# Patient Record
Sex: Male | Born: 1955 | Race: White | Hispanic: No | Marital: Married | Smoking: Former smoker
Health system: Southern US, Community
[De-identification: ages and names within clinical notes are randomized; demographics above are authoritative.]

## PROBLEM LIST (undated history)

## (undated) DIAGNOSIS — K279 Peptic ulcer, site unspecified, unspecified as acute or chronic, without hemorrhage or perforation: Secondary | ICD-10-CM

## (undated) DIAGNOSIS — E785 Hyperlipidemia, unspecified: Secondary | ICD-10-CM

## (undated) DIAGNOSIS — I10 Essential (primary) hypertension: Secondary | ICD-10-CM

## (undated) DIAGNOSIS — N138 Other obstructive and reflux uropathy: Secondary | ICD-10-CM

## (undated) DIAGNOSIS — J189 Pneumonia, unspecified organism: Secondary | ICD-10-CM

## (undated) DIAGNOSIS — G4730 Sleep apnea, unspecified: Secondary | ICD-10-CM

## (undated) DIAGNOSIS — K219 Gastro-esophageal reflux disease without esophagitis: Secondary | ICD-10-CM

## (undated) DIAGNOSIS — R0683 Snoring: Secondary | ICD-10-CM

## (undated) DIAGNOSIS — R351 Nocturia: Secondary | ICD-10-CM

## (undated) DIAGNOSIS — R200 Anesthesia of skin: Secondary | ICD-10-CM

## (undated) DIAGNOSIS — M1990 Unspecified osteoarthritis, unspecified site: Secondary | ICD-10-CM

## (undated) DIAGNOSIS — J449 Chronic obstructive pulmonary disease, unspecified: Secondary | ICD-10-CM

## (undated) DIAGNOSIS — Z86010 Personal history of colonic polyps: Secondary | ICD-10-CM

## (undated) DIAGNOSIS — R011 Cardiac murmur, unspecified: Secondary | ICD-10-CM

## (undated) DIAGNOSIS — E119 Type 2 diabetes mellitus without complications: Secondary | ICD-10-CM

## (undated) DIAGNOSIS — M549 Dorsalgia, unspecified: Secondary | ICD-10-CM

## (undated) DIAGNOSIS — N401 Enlarged prostate with lower urinary tract symptoms: Secondary | ICD-10-CM

## (undated) HISTORY — DX: Snoring: R06.83

## (undated) HISTORY — DX: Dorsalgia, unspecified: M54.9

## (undated) HISTORY — DX: Anesthesia of skin: R20.0

## (undated) HISTORY — DX: Other obstructive and reflux uropathy: N40.1

## (undated) HISTORY — DX: Pneumonia, unspecified organism: J18.9

## (undated) HISTORY — DX: Type 2 diabetes mellitus without complications: E11.9

## (undated) HISTORY — DX: Sleep apnea, unspecified: G47.30

## (undated) HISTORY — DX: Nocturia: R35.1

## (undated) HISTORY — DX: Unspecified osteoarthritis, unspecified site: M19.90

## (undated) HISTORY — DX: Cardiac murmur, unspecified: R01.1

## (undated) HISTORY — DX: Essential (primary) hypertension: I10

## (undated) HISTORY — DX: Gastro-esophageal reflux disease without esophagitis: K21.9

## (undated) HISTORY — DX: Hyperlipidemia, unspecified: E78.5

## (undated) HISTORY — DX: Personal history of colonic polyps: Z86.010

## (undated) HISTORY — DX: Chronic obstructive pulmonary disease, unspecified: J44.9

## (undated) HISTORY — DX: Peptic ulcer, site unspecified, unspecified as acute or chronic, without hemorrhage or perforation: K27.9

## (undated) HISTORY — DX: Other obstructive and reflux uropathy: N13.8

---

## 1985-01-05 HISTORY — PX: APPENDECTOMY: SHX54

## 1999-10-22 ENCOUNTER — Encounter: Payer: Self-pay | Admitting: Emergency Medicine

## 1999-10-22 ENCOUNTER — Emergency Department (HOSPITAL_COMMUNITY): Admission: EM | Admit: 1999-10-22 | Discharge: 1999-10-22 | Payer: Self-pay | Admitting: Emergency Medicine

## 2003-02-10 ENCOUNTER — Encounter: Admission: RE | Admit: 2003-02-10 | Discharge: 2003-02-10 | Payer: Self-pay | Admitting: Internal Medicine

## 2003-10-17 ENCOUNTER — Encounter: Admission: RE | Admit: 2003-10-17 | Discharge: 2003-10-17 | Payer: Self-pay | Admitting: Endocrinology

## 2007-03-28 ENCOUNTER — Inpatient Hospital Stay (HOSPITAL_COMMUNITY): Admission: EM | Admit: 2007-03-28 | Discharge: 2007-03-29 | Payer: Self-pay | Admitting: Emergency Medicine

## 2007-03-28 ENCOUNTER — Ambulatory Visit: Payer: Self-pay | Admitting: Cardiology

## 2008-05-12 ENCOUNTER — Encounter: Admission: RE | Admit: 2008-05-12 | Discharge: 2008-05-12 | Payer: Self-pay | Admitting: Internal Medicine

## 2008-05-18 ENCOUNTER — Ambulatory Visit: Payer: Self-pay | Admitting: Internal Medicine

## 2008-05-18 DIAGNOSIS — N138 Other obstructive and reflux uropathy: Secondary | ICD-10-CM | POA: Insufficient documentation

## 2008-05-18 DIAGNOSIS — M1990 Unspecified osteoarthritis, unspecified site: Secondary | ICD-10-CM | POA: Insufficient documentation

## 2008-05-18 DIAGNOSIS — K279 Peptic ulcer, site unspecified, unspecified as acute or chronic, without hemorrhage or perforation: Secondary | ICD-10-CM | POA: Insufficient documentation

## 2008-05-18 DIAGNOSIS — R109 Unspecified abdominal pain: Secondary | ICD-10-CM | POA: Insufficient documentation

## 2008-05-18 DIAGNOSIS — N401 Enlarged prostate with lower urinary tract symptoms: Secondary | ICD-10-CM | POA: Insufficient documentation

## 2008-05-18 DIAGNOSIS — I10 Essential (primary) hypertension: Secondary | ICD-10-CM | POA: Insufficient documentation

## 2008-05-18 DIAGNOSIS — K219 Gastro-esophageal reflux disease without esophagitis: Secondary | ICD-10-CM | POA: Insufficient documentation

## 2008-06-01 ENCOUNTER — Ambulatory Visit: Payer: Self-pay | Admitting: Internal Medicine

## 2008-06-01 DIAGNOSIS — M542 Cervicalgia: Secondary | ICD-10-CM | POA: Insufficient documentation

## 2008-06-14 ENCOUNTER — Ambulatory Visit: Payer: Self-pay | Admitting: Internal Medicine

## 2008-06-14 DIAGNOSIS — R011 Cardiac murmur, unspecified: Secondary | ICD-10-CM | POA: Insufficient documentation

## 2008-06-18 ENCOUNTER — Telehealth: Payer: Self-pay | Admitting: Internal Medicine

## 2008-06-19 ENCOUNTER — Telehealth: Payer: Self-pay | Admitting: Internal Medicine

## 2008-06-21 ENCOUNTER — Ambulatory Visit: Payer: Self-pay

## 2008-06-21 ENCOUNTER — Encounter: Payer: Self-pay | Admitting: Internal Medicine

## 2008-06-22 ENCOUNTER — Encounter: Payer: Self-pay | Admitting: Internal Medicine

## 2008-07-13 ENCOUNTER — Ambulatory Visit: Payer: Self-pay | Admitting: Internal Medicine

## 2008-07-13 DIAGNOSIS — M549 Dorsalgia, unspecified: Secondary | ICD-10-CM | POA: Insufficient documentation

## 2008-07-26 ENCOUNTER — Telehealth: Payer: Self-pay | Admitting: Internal Medicine

## 2008-07-26 ENCOUNTER — Encounter: Payer: Self-pay | Admitting: Internal Medicine

## 2008-07-26 ENCOUNTER — Encounter (INDEPENDENT_AMBULATORY_CARE_PROVIDER_SITE_OTHER): Payer: Self-pay | Admitting: *Deleted

## 2008-09-11 ENCOUNTER — Encounter
Admission: RE | Admit: 2008-09-11 | Discharge: 2008-12-10 | Payer: Self-pay | Admitting: Physical Medicine & Rehabilitation

## 2008-09-13 ENCOUNTER — Ambulatory Visit: Payer: Self-pay | Admitting: Physical Medicine & Rehabilitation

## 2008-09-14 ENCOUNTER — Ambulatory Visit: Payer: Self-pay | Admitting: Internal Medicine

## 2008-09-20 ENCOUNTER — Encounter
Admission: RE | Admit: 2008-09-20 | Discharge: 2008-11-14 | Payer: Self-pay | Admitting: Physical Medicine & Rehabilitation

## 2008-10-11 ENCOUNTER — Ambulatory Visit: Payer: Self-pay | Admitting: Physical Medicine & Rehabilitation

## 2008-10-25 ENCOUNTER — Ambulatory Visit: Payer: Self-pay | Admitting: Physical Medicine & Rehabilitation

## 2008-11-23 ENCOUNTER — Ambulatory Visit: Payer: Self-pay | Admitting: Physical Medicine & Rehabilitation

## 2009-02-07 ENCOUNTER — Encounter
Admission: RE | Admit: 2009-02-07 | Discharge: 2009-02-12 | Payer: Self-pay | Admitting: Physical Medicine & Rehabilitation

## 2009-02-12 ENCOUNTER — Ambulatory Visit: Payer: Self-pay | Admitting: Physical Medicine & Rehabilitation

## 2009-03-07 IMAGING — CR DG CHEST 2V
2 series · 2 of 2 positions shown · non-contrast
Comparison: 10/02/2003

CLINICAL DATA: *Chest pain;

CHEST - 2 VIEW

[w chest pa]
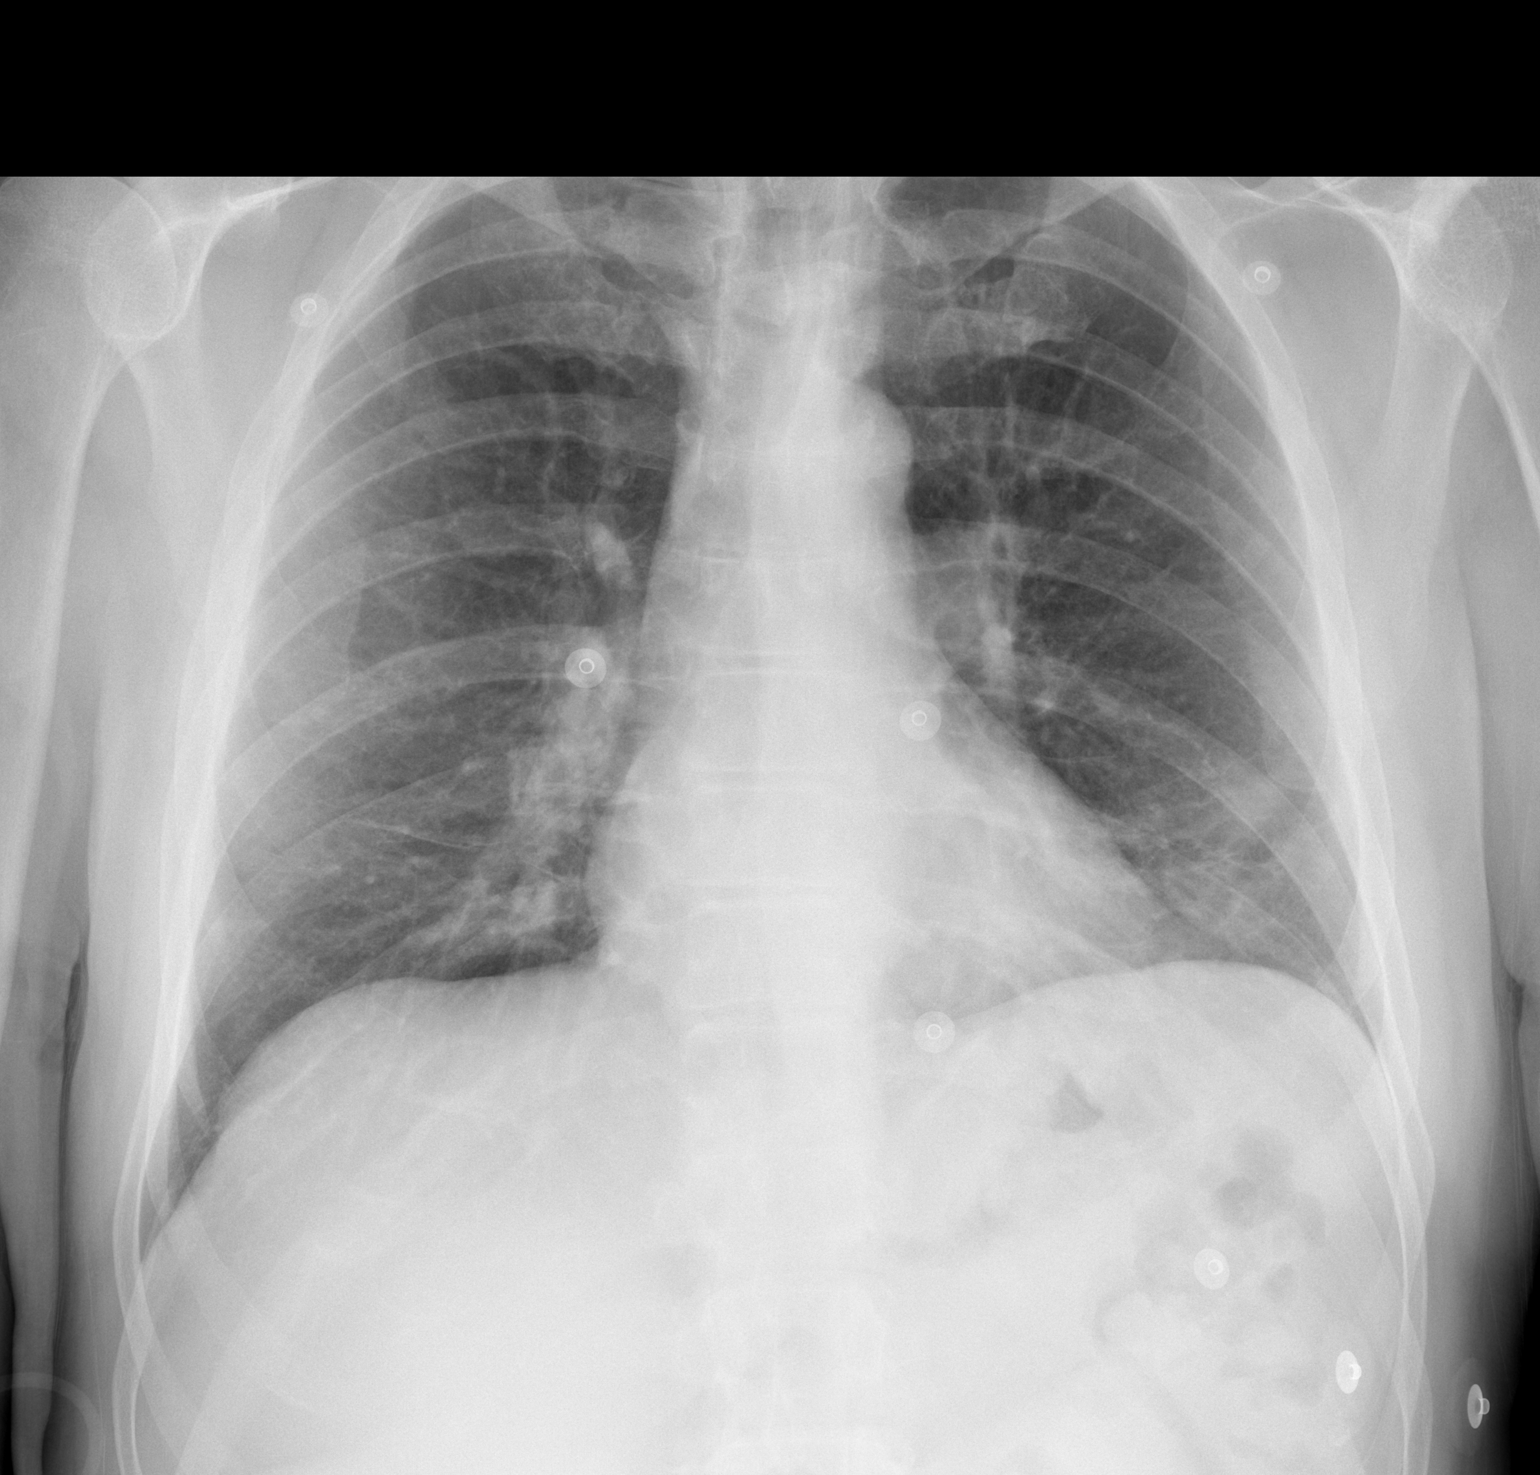

[w chest lat]
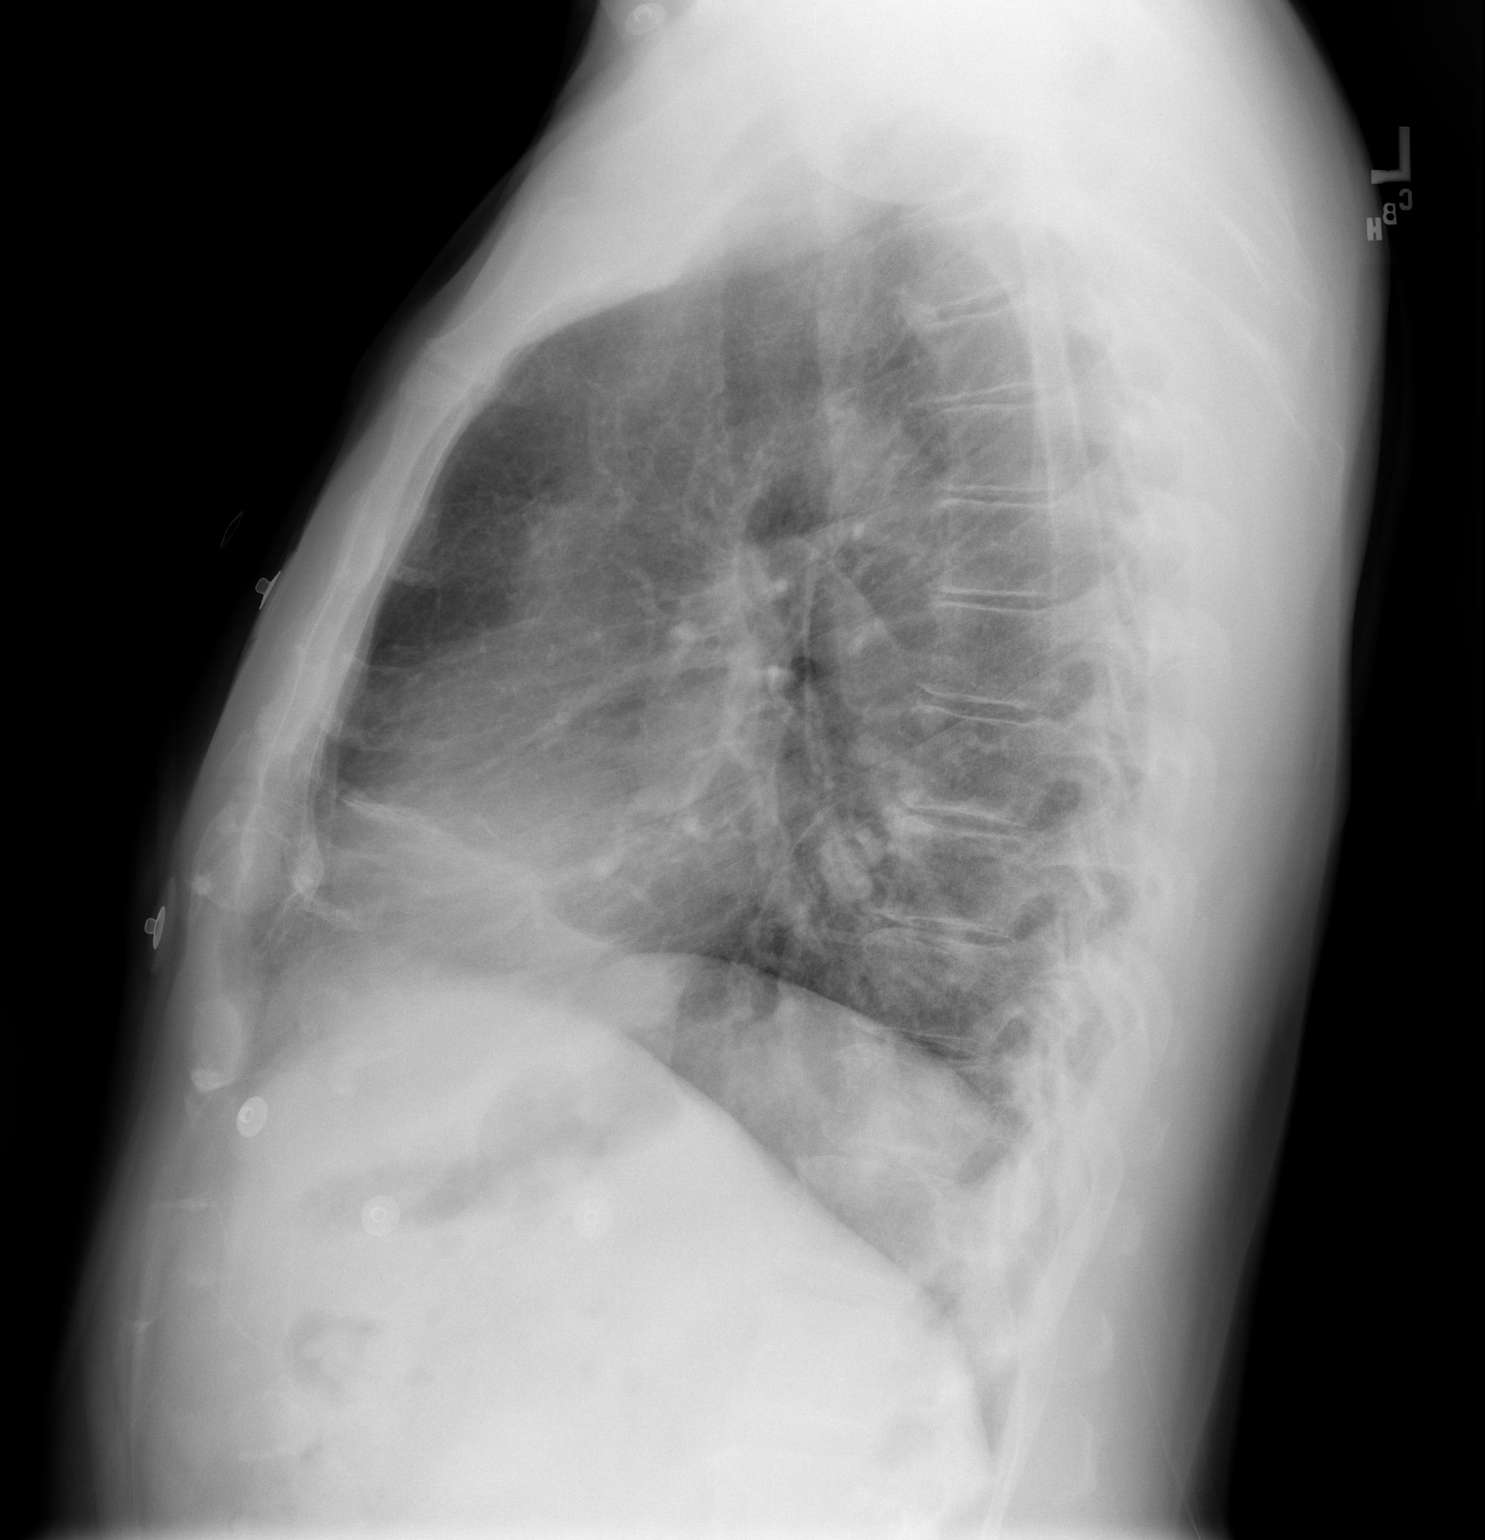

[2 of 2 positions shown; findings below may reference images not displayed]

FINDINGS: Patchy lingular airspace opacity.  Right lung clear.
Heart size normal.  No effusion.  Visualized bones unremarkable.
IMPRESSION: 1.  Patchy lingular opacity possibly pneumonia

## 2009-05-03 ENCOUNTER — Encounter
Admission: RE | Admit: 2009-05-03 | Discharge: 2009-05-07 | Payer: Self-pay | Admitting: Physical Medicine & Rehabilitation

## 2009-05-07 ENCOUNTER — Ambulatory Visit: Payer: Self-pay | Admitting: Physical Medicine & Rehabilitation

## 2009-10-21 ENCOUNTER — Encounter
Admission: RE | Admit: 2009-10-21 | Discharge: 2009-10-25 | Payer: Self-pay | Admitting: Physical Medicine & Rehabilitation

## 2009-10-25 ENCOUNTER — Ambulatory Visit: Payer: Self-pay | Admitting: Physical Medicine & Rehabilitation

## 2010-05-20 NOTE — H&P (Signed)
NAME:  Benjamin Conner, Benjamin Conner NO.:  [id number]   MEDICAL RECORD NO.:  [id number]          PATIENT TYPE:  INP   LOCATION:  1828                         FACILITY:  [facility]   PHYSICIAN:  Jonelle Sidle, MD DATE OF BIRTH:  07/03/1955   DATE OF ADMISSION:  03/28/2007  DATE OF DISCHARGE:                              HISTORY & PHYSICAL   PRIMARY CARDIOLOGIST:  New, seeing Dr. Diona Browner.   PRIMARY CARE PHYSICIAN:  He is pending a visit with Dr. Romero Belling.   PATIENT PROFILE:  A 55 year old married Caucasian male with prior family  history of CAD as well as untreated hypertension and tobacco abuse who  presents with unstable angina.   PROBLEMS:  1. Unstable angina.  2. Untreated hypertension.  3. Ongoing tobacco abuse with 35+ pack-year history, currently smoking      a pack a day.  4. Status post appendectomy about 15-20 years ago.  5. Status post left foot surgery for bone spurs.   HISTORY OF PRESENT ILLNESS:  A 55 year old married Caucasian male with  family history of CAD, untreated hypertension, and ongoing tobacco  abuse. He works night shift and was in his usual state of health until  approximately 5 p.m. when he awoke with 10/10 fairly focal and sharp  chest pain at the left upper sternal border with associated dyspnea,  nausea, diaphoresis, and lightheadedness. As he got up to shower and  dress, his symptoms became significantly worse and also changed into a  more retrosternal chest heaviness. After getting dressed, he presented  to urgent care where he was treated with two sublingual nitroglycerin  tablets with reduction in pain from 10/10 to 4/10. EMS was activated,  and he was given an additional nitroglycerin en route to the ED. His  pain resolved completely. We were asked to see the patient by Dr.  Merla Riches who called [place] from urgent care. Currently, Benjamin Conner is pain  free and offers no complaints.   ALLERGIES:  No known drug allergies.   HOME  MEDICATIONS:  Aspirin 81 mg every day.   FAMILY HISTORY:  Mother is alive at age 46 with a history of CAD, status  post PCI last year. Father died at age 52 following multiple MIs, the  first of which occurred in his early 6's. He also had a history of  COPD. The patient has a brother who has a history of diabetes.   SOCIAL HISTORY:  Lives in [place] with his wife. 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 He has two grown  children and two grandchildren. He has a 35+ pack-year history of  tobacco abuse, currently smoking one pack a day. He usually drinks two  beers a day and more on the weekends. He denies any drug use. He does  not routinely exercise.   REVIEW OF SYSTEMS:  Positive for chest pain and shortness of breath,  dyspnea on exertion, lightheadedness, nausea, and constipation.  Otherwise, all systems reviewed and negative.   PHYSICAL EXAMINATION:  VITAL SIGNS:  Temperature 97.7. Heart rate 84,  respirations  16, blood pressure 138/88, pulse-ox 97% on 4 liters.  GENERAL:  Pleasant white male in no acute distress. Awake, alert, and  oriented x3.  NECK:  No bruits or JVD.  LUNGS:  Respirations regular and unlabored. Clear to auscultation.  CARDIAC:  Regular S1, S2. No S3, S4 or murmurs.  ABDOMEN:  Round, soft, nontender, nondistended. Bowel sounds are present  x4.  EXTREMITIES:  Warm, dry, pink. No clubbing, cyanosis, or edema. Dorsalis  pedis, posterior tibial pulses 2+ and equal bilaterally. No femoral  bruits are noted.  HEENT:  Normal.  NEURO:  Grossly intact and nonfocal.  SKIN:  Warm and dry without lesions or masses.   Chest x-ray is pending. EKG shows sinus rhythm at a rate of 77, left  axis deviation. There is poor R-wave progression and 0.5 to 1 mm J-point  elevation of V2 through V3.   LABORATORY DATA:  Pending.   ASSESSMENT AND PLAN:  1. Unstable angina. Symptoms concerning for angina. He is now pain      free. We will  plan to admit and cycle cardiac markers. Add heparin,      nitro, beta-blocker, aspirin, statin, Plavix, and plan      catheterization in the a.m.  2. Hypertension. Add beta-blocker for now. Follow.  3. Lipid status currently unknown. Check lipids and liver function      tests. Add statin.  4. Tobacco abuse. Cessation strongly advised. Will obtain smoking      cessation counsel.      [name], ANP      Jonelle Sidle, MD  Electronically Signed    CB/MEDQ  D:  03/28/2007  T:  03/28/2007  Job:  [id number]

## 2010-05-20 NOTE — Discharge Summary (Signed)
NAME:  ANIRUDH, BAIZ NO.:  [id number]   MEDICAL RECORD NO.:  [id number]          PATIENT TYPE:  INP   LOCATION:  3707                         FACILITY:  [facility]   PHYSICIAN:  Rollene Rotunda, MD, FACCDATE OF BIRTH:  06/26/1955   DATE OF ADMISSION:  03/28/2007  DATE OF DISCHARGE:  03/29/2007                               DISCHARGE SUMMARY   ADDENDUM.   MEDICATIONS:  1. Aspirin 81 mg daily.  2. Avelox 400 mg daily x6 additional days.  3. Hydrochlorothiazide 12.5 mg daily.  4. K-Dur 10 mEq daily.      [name], ANP      Rollene Rotunda, MD, [facility]  Electronically Signed    CB/MEDQ  D:  03/29/2007  T:  03/30/2007  Job:  [id number]

## 2010-05-20 NOTE — Cardiovascular Report (Signed)
NAME:  Benjamin Conner, Benjamin Conner NO.:  [id number]   MEDICAL RECORD NO.:  [id number]          PATIENT TYPE:  INP   LOCATION:  3707                         FACILITY:  [facility]   PHYSICIAN:  Rollene Rotunda, MD, FACCDATE OF BIRTH:  1955/06/17   DATE OF PROCEDURE:  03/29/2007  DATE OF DISCHARGE:                            CARDIAC CATHETERIZATION   PRIMARY:  None.   CARDIOLOGIST:  Jonelle Sidle, M.D.   PROCEDURE:  Left heart catheterization/coronary arteriography.   INDICATION:  Evaluate patient with chest pain suggestive of unstable  angina.   PROCEDURE NOTE:  Left heart catheterization was performed via the right  femoral artery.  The artery was cannulated using anterior wall puncture.  A #6-French arterial sheath was inserted via the modified Seldinger  technique.  Preformed Judkins and a pigtail catheter were utilized.  The  patient tolerated the procedure well and left the lab in stable  condition.   RESULTS:  Hemodynamics:  LV 169/14, AO 164/82.   Coronaries:  The left main was normal.  The LAD wrapped the apex and was  normal.  First diagonal was large with ostial 25% stenosis.  The  circumflex in the AV groove was normal.  Ramus intermediate was moderate-  sized and normal.  Mid obtuse marginal was large and normal.  The right  coronary artery was a large dominant vessel.  It was normal throughout  its course.  The PDA was large and normal.  Posterolateral was moderate-  sized and normal.   LEFT VENTRICULOGRAM:  The left ventriculogram was obtained in the RAO  projection.  The EF was 65%.   CONCLUSION:  Normal coronaries.  Normal left ventricular function.   PLAN:  No further cardiac workup is suggested.  The patient does have an  infiltrate on chest x-ray and is being managed for probable pneumonia.  This may explain his symptoms.      Rollene Rotunda, MD, [facility]  Electronically Signed     JH/MEDQ  D:  03/29/2007  T:  03/29/2007  Job:  [id number]   cc:    Jonelle Sidle, MD

## 2010-05-20 NOTE — Discharge Summary (Signed)
NAME:  Benjamin Conner, Benjamin Conner NO.:  [id number]   MEDICAL RECORD NO.:  [id number]          PATIENT TYPE:  INP   LOCATION:  3707                         FACILITY:  [facility]   PHYSICIAN:  Rollene Rotunda, MD, FACCDATE OF BIRTH:  04/08/1955   DATE OF ADMISSION:  03/28/2007  DATE OF DISCHARGE:  03/29/2007                               DISCHARGE SUMMARY   PRIMARY CARDIOLOGIST:  Jonelle Sidle, MD   PRIMARY CARE Leya Paige:  Cleophas Dunker. Everardo All, MD   DISCHARGE DIAGNOSIS:  Chest pain.   SECONDARY DIAGNOSES:  1. Community-acquired pneumonia.  2. Hypertension.  3. Ongoing tobacco abuse.  4. Status post appendectomy about 15-20 years ago.  5. Status post left foot surgery secondary to bone spurs.   ALLERGIES:  No known drug allergies.   PROCEDURE:  Left heart cardiac catheterization.   HISTORY OF PRESENT ILLNESS:  A 55 year old married Caucasian male  without prior cardiac history.  He was in his usual state of health  until the afternoon of March 28, 2007, when he awoke with 10/10, sharp,  shooting left-sided chest discomfort, which became heavy and tight in  nature, and associated with nausea, shortness of breath, diaphoresis,  and lightheadedness.  Symptoms worsened with exertion, prompting him to  present to urgent care where he was treated with nitroglycerin and  aspirin with some relief.  EMS brought the patient to the [facility] ED,  and on en route he received an additional nitroglycerin with complete  relief of discomfort.  In the ED, he was pain free.  ECG showed minimal  J-point elevation in lead V2 and V3.  The patient was admitted for rule  out.   HOSPITAL COURSE:  The patient ruled out for MI and did have some mild  chest heaviness overnight.  Chest x-ray was reviewed and showed patchy  lingular opacity with possible pneumonia.  He was afebrile; however, his  white count was initially elevated at 14.6 and then down to 12.8 on  March 29, 2007.  He was initially on  Avelox therapy.  Secondary to  concerns regarding angina with significant family history of coronary  disease along with untreated hypertension, and ongoing tobacco abuse,  the patient underwent left heart cardiac catheterization this afternoon  revealing normal coronary arteries with an EF of 65% and no wall motion  abnormalities. Mr. Mahler will be discharged home post catheterization.   DISCHARGE LABS:  Hemoglobin 14.6, hematocrit 42.6, WBC 12.8, and  platelets 231.  Sodium 136, potassium 3.9, chloride 105, CO2 25, BUN 16,  creatinine 0.78, and glucose 78.  LFTS were within normal limits.  CK  92, MB 4.3 and troponin I 0.02.  Total cholesterol 178, triglycerides  203, HDL 27,  and LDL 110.  TSH 1.736.   DISPOSITION:  The patient is being discharged home today in good  condition.   FOLLOWUP PLANS AND APPOINTMENTS:  We have asked him to follow up with  Dr. Everardo All in 1-2 weeks.   DISCHARGE MEDICATIONS:  1. Aspirin 81 mg daily.  2. Avelox 400 mg daily.  3. Hydrochlorothiazide 12.5 mg daily.  4. K-Dur 10 mEq daily.      [name], ANP      Rollene Rotunda, MD, [facility]  Electronically Signed    CB/MEDQ  D:  03/29/2007  T:  03/30/2007  Job:  [id number]   cc:   Gregary Signs A. Everardo All, MD

## 2010-09-29 LAB — COMPREHENSIVE METABOLIC PANEL
AST: 26
BUN: 16
CO2: 25
Calcium: 9.6
Chloride: 105
Creatinine, Ser: 0.78
GFR calc Af Amer: >60
GFR calc non Af Amer: >60
Total Bilirubin: 1

## 2010-09-29 LAB — LIPID PANEL
Total CHOL/HDL Ratio: 6.6
Triglycerides: 203 — ABNORMAL HIGH

## 2010-09-29 LAB — CBC
HCT: 42.6
HCT: 46.2
MCHC: 34.3
MCHC: 35
MCV: 89.9
MCV: 90.4
Platelets: 231
Platelets: 233
RDW: 12.1
WBC: 12.8 — ABNORMAL HIGH
WBC: 14.6 — ABNORMAL HIGH

## 2010-09-29 LAB — DIFFERENTIAL
Basophils Absolute: 0.1
Lymphocytes Relative: 19
Lymphs Abs: 2.7
Neutro Abs: 10.4 — ABNORMAL HIGH
Neutrophils Relative %: 71

## 2010-09-29 LAB — MAGNESIUM: Magnesium: 2.3

## 2010-09-29 LAB — HEPARIN LEVEL (UNFRACTIONATED): Heparin Unfractionated: 0.34

## 2010-09-29 LAB — PROTIME-INR
INR: 0.9
Prothrombin Time: 12.4

## 2010-09-29 LAB — APTT: aPTT: 29

## 2010-09-29 LAB — CK TOTAL AND CKMB (NOT AT ARMC): Relative Index: INVALID

## 2010-09-29 LAB — POCT CARDIAC MARKERS: Operator id: 277751

## 2010-09-29 LAB — TROPONIN I: Troponin I: 0.02

## 2011-05-27 ENCOUNTER — Encounter: Payer: Self-pay | Admitting: Physician Assistant

## 2011-05-27 ENCOUNTER — Ambulatory Visit (INDEPENDENT_AMBULATORY_CARE_PROVIDER_SITE_OTHER): Payer: BC Managed Care – PPO | Admitting: Family Medicine

## 2011-05-27 VITALS — BP 141/90 | HR 80 | Temp 97.8°F | Resp 16 | Ht 74.5 in | Wt 246.8 lb

## 2011-05-27 DIAGNOSIS — M479 Spondylosis, unspecified: Secondary | ICD-10-CM

## 2011-05-27 DIAGNOSIS — E8881 Metabolic syndrome: Secondary | ICD-10-CM

## 2011-05-27 DIAGNOSIS — I10 Essential (primary) hypertension: Secondary | ICD-10-CM

## 2011-05-27 DIAGNOSIS — R7989 Other specified abnormal findings of blood chemistry: Secondary | ICD-10-CM

## 2011-05-27 DIAGNOSIS — R42 Dizziness and giddiness: Secondary | ICD-10-CM

## 2011-05-27 DIAGNOSIS — I493 Ventricular premature depolarization: Secondary | ICD-10-CM

## 2011-05-27 LAB — CBC WITH DIFFERENTIAL/PLATELET
Basophils Absolute: 0 10*3/uL (ref 0.0–0.1)
Eosinophils Relative: 3 % (ref 0–5)
HCT: 45.2 % (ref 39.0–52.0)
Hemoglobin: 15.8 g/dL (ref 13.0–17.0)
Lymphocytes Relative: 23 % (ref 12–46)
MCV: 85.6 fL (ref 78.0–100.0)
Monocytes Absolute: 0.8 10*3/uL (ref 0.1–1.0)
Monocytes Relative: 9 % (ref 3–12)
RDW: 12.6 % (ref 11.5–15.5)
WBC: 9.2 10*3/uL (ref 4.0–10.5)

## 2011-05-27 LAB — COMPREHENSIVE METABOLIC PANEL
AST: 41 U/L — ABNORMAL HIGH (ref 0–37)
BUN: 17 mg/dL (ref 6–23)
CO2: 25 mEq/L (ref 19–32)
Calcium: 10.3 mg/dL (ref 8.4–10.5)
Chloride: 102 mEq/L (ref 96–112)
Creat: 1.11 mg/dL (ref 0.50–1.35)
Glucose, Bld: 125 mg/dL — ABNORMAL HIGH (ref 70–99)

## 2011-05-27 LAB — LIPID PANEL
Cholesterol: 232 mg/dL — ABNORMAL HIGH (ref 0–200)
HDL: 40 mg/dL (ref >39)
Total CHOL/HDL Ratio: 5.8 Ratio
Triglycerides: 491 mg/dL — ABNORMAL HIGH (ref <150)

## 2011-05-27 MED ORDER — MELOXICAM 15 MG PO TABS: 15.0000 mg | ORAL_TABLET | Freq: Every day | ORAL | Status: DC

## 2011-05-27 MED ORDER — ESOMEPRAZOLE MAGNESIUM 40 MG PO CPDR: 40.0000 mg | DELAYED_RELEASE_CAPSULE | Freq: Every day | ORAL | Status: DC

## 2011-05-27 MED ORDER — FLUTICASONE PROPIONATE 50 MCG/ACT NA SUSP: 2.0000 | Freq: Every day | NASAL | Status: DC

## 2011-05-27 MED ORDER — VALSARTAN-HYDROCHLOROTHIAZIDE 160-25 MG PO TABS: 1.0000 | ORAL_TABLET | Freq: Every day | ORAL | Status: DC

## 2011-05-27 NOTE — Progress Notes (Signed)
  Subjective:    Patient ID: Benjamin Conner, male    DOB: August 01, 1955, 56 y.o.   MRN: [id number]  HPI Pt here for check up of htn, elevated glucose, and hyperlipidemia.  Saw the neurologist for his back pain who felt his pain is mostly related to DJD.  He is on gabapentin 300mg  (2bid) and flexeril 5mg  (2bid) which has his pain adequately controlled.  He c/o a few episodes of dizziness over the last few days that are brief.  He does admit to some chest tightness but can't relate it to the same time as the dizziness.  (Also has some nasal congestion and allergy symptoms.).  Denies any SOB, DOE, palpitations, jaw, arm, or back pain.  His dad developed afib in his late 71s or early 24s.  Died at 64 with and MI and pneumonia.  Episodes of dizziness have been fleeting and 2-3 times.  Urology is still following him for slightly enlarged prostate.   Review of Systems  All other systems reviewed and are negative.       Objective:   Physical Exam  Constitutional: He is oriented to person, place, and time. He appears well-developed and well-nourished.       obese  HENT:  Head: Normocephalic and atraumatic.       TM B full, no infection. Nasal turbinates enlarged and boggy.  Neck: Normal range of motion. Neck supple. No JVD present.  Cardiovascular: Normal rate, regular rhythm, normal heart sounds and intact distal pulses.  Exam reveals no gallop and no friction rub.   No murmur heard. Pulmonary/Chest: Effort normal and breath sounds normal. No respiratory distress. He has no wheezes. He has no rales. He exhibits no tenderness.  Neurological: He is alert and oriented to person, place, and time.  Skin: Skin is warm and dry.     EKG:  No Changes on EKG from 2009, but there is a PVC on his 3 page rhythm strip. Reviewed by myself and Dr. Audria Nine.      Assessment & Plan:  Dizziness-R/O cardiac cause.  Advised 911 if CP develops. Abnormal EKG. Htn-stable when he takes his meds-he hasn't had any  today. Hyperlipidemia/metabolic syndrome/elevated glucose w/o diagnosis of diabetes.  Advised cut out sugars and refined carbs.  [facility] Diet program. Back pain-stable.  It is fine with me for [place] to RF his gabapentin and flexeril if it is needed.

## 2011-05-28 ENCOUNTER — Encounter: Payer: Self-pay | Admitting: Cardiology

## 2011-05-28 ENCOUNTER — Ambulatory Visit (INDEPENDENT_AMBULATORY_CARE_PROVIDER_SITE_OTHER): Payer: Self-pay | Admitting: Cardiology

## 2011-05-28 VITALS — BP 144/92 | HR 97 | Ht 75.0 in | Wt 248.0 lb

## 2011-05-28 DIAGNOSIS — R011 Cardiac murmur, unspecified: Secondary | ICD-10-CM

## 2011-05-28 DIAGNOSIS — R42 Dizziness and giddiness: Secondary | ICD-10-CM

## 2011-05-28 DIAGNOSIS — R079 Chest pain, unspecified: Secondary | ICD-10-CM

## 2011-05-28 DIAGNOSIS — I10 Essential (primary) hypertension: Secondary | ICD-10-CM

## 2011-05-28 NOTE — Progress Notes (Signed)
HPI: 56 year old male for evaluation of dizziness. Echocardiogram in June of 2010 showed normal LV function and grade 1 diastolic dysfunction. Laboratories on 05/27/2011 showed a hemoglobin of 15.8. Potassium 4.0. TSH 2.968. Patient has dyspnea with more extreme activities but not routine activities. No orthopnea, PND or pedal edema. Has occasional chest pain. Substernal without radiation. Associated shortness of breath and diaphoresis but no nausea. Pain can increase with inspiration. Last several minutes to 15 minutes. Not exertional. Over the last several days has noted some dizziness. Last all day at times. No syncope. No palpitations.  Current Outpatient Prescriptions  Medication Sig Dispense Refill  . aspirin 81 MG tablet Take 81 mg by mouth daily.      . cyclobenzaprine (FLEXERIL) 5 MG tablet Take 5 mg by mouth 3 (three) times daily as needed.      Marland Kitchen esomeprazole (NEXIUM) 40 MG capsule Take 1 capsule (40 mg total) by mouth daily before breakfast.  90 capsule  3  . fexofenadine (ALLEGRA) 180 MG tablet Take 180 mg by mouth daily.      . fluticasone (FLONASE) 50 MCG/ACT nasal spray Place 2 sprays into the nose daily.  16 g  6  . gabapentin (NEURONTIN) 300 MG capsule Take 600 mg by mouth 2 (two) times daily.      . meloxicam (MOBIC) 15 MG tablet Take 1 tablet (15 mg total) by mouth daily.  90 tablet  3  . Omega-3 Fatty Acids (FISH OIL PO) Take 1 tablet by mouth daily.      . valsartan-hydrochlorothiazide (DIOVAN-HCT) 160-25 MG per tablet Take 1 tablet by mouth daily.  90 tablet  3    Allergies  Allergen Reactions  . Asa (Aspirin)     Past Medical History  Diagnosis Date  . HYPERTENSION   . GERD   . PEPTIC ULCER DISEASE   . HYPERTROPHY PROSTATE W/UR OBST & OTH LUTS   . OSTEOARTHRITIS   . BACK PAIN   . Hyperlipidemia   . Pneumonia     Past Surgical History  Procedure Date  . Appendectomy     History   Social History  . Marital Status: Married    Spouse Name: N/A    Number  of Children: 2  . Years of Education: N/A   Occupational History  .      Cashier   Social History Main Topics  . Smoking status: Former Games developer  . Smokeless tobacco: Not on file  . Alcohol Use: Yes     Occasional  . Drug Use: Not on file  . Sexually Active: Not on file   Other Topics Concern  . Not on file   Social History Narrative  . No narrative on file    Family History  Problem Relation Age of Onset  . Heart disease Father   . Coronary artery disease Mother     Stent at age 92    ROS: problems with back pain but no fevers or chills, productive cough, hemoptysis, dysphasia, odynophagia, melena, hematochezia, dysuria, hematuria, rash, seizure activity, orthopnea, PND, pedal edema, claudication. Remaining systems are negative.  Physical Exam:   Blood pressure 144/92, pulse 97, height 6\' 3"  (1.905 m), weight 112.492 kg (248 lb).  General:  Well developed/well nourished in NAD Skin warm/dry Patient not depressed No peripheral clubbing Back-normal HEENT-normal/normal eyelids Neck supple/normal carotid upstroke bilaterally; no bruits; no JVD; no thyromegaly chest - CTA/ normal expansion CV - RRR/normal S1 and S2; no rubs or gallops;  PMI nondisplaced, 2/6  systolic murmur left sternal border. Abdomen -NT/ND, no HSM, no mass, + bowel sounds, no bruit 2+ femoral pulses, no bruits Ext-no edema, chords, 2+ DP Neuro-grossly nonfocal  ECG electrocardiogram on 05/27/2011 showed sinus rhythm with no ST changes. Rhythm strip showed isolated PVC.

## 2011-05-28 NOTE — Assessment & Plan Note (Signed)
Plan echocardiogram to further assess. 

## 2011-05-28 NOTE — Patient Instructions (Signed)
Your physician has requested that you have an echocardiogram. Echocardiography is a painless test that uses sound waves to create images of your heart. It provides your doctor with information about the size and shape of your heart and how well your heart's chambers and valves are working. This procedure takes approximately one hour. There are no restrictions for this procedure.   Your physician has requested that you have an exercise tolerance test. For further information please visit [email or web address] Please also follow instruction sheet, as given.  Exercise Stress Electrocardiography An exercise stress test is a heart test (EKG) which is done while you are moving. You will walk on a treadmill. This test will tell your doctor how your heart does when it is forced to work harder and how much activity you can safely handle. BEFORE THE TEST  Wear shorts or athletic pants.   Wear comfortable tennis shoes.   Women need to wear a bra that allows patches to be put on under it.  TEST  An EKG cable will be attached to your waist. This cable is hooked up to patches, which look like round stickers stuck to your chest.   You will be asked to walk on the treadmill.   You will walk until you are too tired or until you are told to stop.   Tell the doctor right away if you have:   Chest pain.   Leg cramps.   Shortness of breath.   Dizziness.   The test may last 30 minutes to 1 hour. The timing depends on your physical condition and the condition of your heart.  AFTER THE TEST  You will rest for about 6 minutes. During this time, your heart rhythm and blood pressure will be checked.   The testing equipment will be removed from your body and you can get dressed.   You may go home or back to your hospital room. You may keep doing all your usual activities as told by your doctor.  Finding out the results of your test Ask when your test results will be ready. Make sure you get your test  results. Document Released: 06/10/2007 Document Revised: 12/11/2010 Document Reviewed: 06/10/2007 [facility] Patient Information 2012 [place], [place].

## 2011-05-28 NOTE — Assessment & Plan Note (Signed)
Symptoms atypical. Schedule exercise treadmill. 

## 2011-05-28 NOTE — Assessment & Plan Note (Signed)
Electrocardiogram is normal. Occasional PVC. No association of dizziness with palpitations. I do not think this is cardiac related. No plans for further cardiac evaluation from this standpoint.

## 2011-05-28 NOTE — Assessment & Plan Note (Signed)
Blood pressure elevated. Continue present medications and follow. Increase medications as needed. 

## 2011-05-29 ENCOUNTER — Other Ambulatory Visit: Payer: Self-pay

## 2011-05-29 ENCOUNTER — Ambulatory Visit (HOSPITAL_COMMUNITY): Payer: BC Managed Care – PPO | Attending: Cardiology

## 2011-05-29 DIAGNOSIS — R079 Chest pain, unspecified: Secondary | ICD-10-CM | POA: Insufficient documentation

## 2011-05-29 DIAGNOSIS — I10 Essential (primary) hypertension: Secondary | ICD-10-CM | POA: Insufficient documentation

## 2011-05-29 DIAGNOSIS — R42 Dizziness and giddiness: Secondary | ICD-10-CM | POA: Insufficient documentation

## 2011-05-29 DIAGNOSIS — I519 Heart disease, unspecified: Secondary | ICD-10-CM | POA: Insufficient documentation

## 2011-05-29 DIAGNOSIS — I517 Cardiomegaly: Secondary | ICD-10-CM | POA: Insufficient documentation

## 2011-05-29 DIAGNOSIS — R011 Cardiac murmur, unspecified: Secondary | ICD-10-CM

## 2011-05-29 DIAGNOSIS — Z87891 Personal history of nicotine dependence: Secondary | ICD-10-CM | POA: Insufficient documentation

## 2011-06-08 ENCOUNTER — Telehealth: Payer: Self-pay | Admitting: Cardiology

## 2011-06-08 NOTE — Telephone Encounter (Signed)
Echocardiogram results given to pt. 

## 2011-06-08 NOTE — Telephone Encounter (Signed)
New problem:  Patient calling for test result

## 2011-06-19 ENCOUNTER — Ambulatory Visit (INDEPENDENT_AMBULATORY_CARE_PROVIDER_SITE_OTHER): Payer: BC Managed Care – PPO | Admitting: Nurse Practitioner

## 2011-06-19 ENCOUNTER — Encounter: Payer: Self-pay | Admitting: Nurse Practitioner

## 2011-06-19 DIAGNOSIS — R079 Chest pain, unspecified: Secondary | ICD-10-CM

## 2011-06-19 NOTE — Patient Instructions (Signed)
Walking program is encouraged.  We will see you back in 3 months but sooner if you continue to have symptoms.

## 2011-06-19 NOTE — Procedures (Signed)
Exercise Treadmill Test  Pre-Exercise Testing Evaluation Rhythm: normal sinus  Rate: 73   PR:  .18 QRS:  .09  QT:  .39 QTc: .43     Test  Exercise Tolerance Test Ordering MD: Olga Millers, MD  Interpreting MD: Norma Fredrickson ,NP  Unique Test No: 1  Treadmill:  1  Indication for ETT: chest pain - rule out ischemia  Contraindication to ETT: No   Stress Modality: exercise - treadmill  Cardiac Imaging Performed: non   Protocol: standard Bruce - maximal  Max BP: 189/79  Max MPHR (bpm):  165 85% MPR (bpm):  140  MPHR obtained (bpm):  148 % MPHR obtained:  89%  Reached 85% MPHR (min:sec): 6:30 Total Exercise Time (min-sec):  6:49  Workload in METS:  8.2 Borg Scale: 15  Reason ETT Terminated:  patient's desire to stop    ST Segment Analysis At Rest: normal ST segments - no evidence of significant ST depression With Exercise: no evidence of significant ST depression  Other Information Arrhythmia:  No Angina during ETT:  Minimal chest discomfort at the peak of exercise that resolved quickly in recovery. Mostly limited by dyspnea.   Quality of ETT:  diagnostic  ETT Interpretation:  normal - no evidence of ischemia by ST analysis  Comments: Patient presents today for GXT for atypical chest pain. Has had recent echo showing normal EF and mild LVH. He has done well without any symptoms since his visit here in May. Exercised on the standard Bruce protocol for a total of 6:49. Reduced exercise tolerance. Adequate blood pressure response. Clinically with shortness of breath. Very mild chest discomfort that was relieved very quickly. Mostly limited by dyspnea and fatigue. EKG was negative. No arrhythmia. Overall, negative for ischemia.   Recommendations: He has poor exercise tolerance. EKG is negative for ischemia. Not on an active exercise program. Have encouraged him to start a walking program. Would like to see him back in 3 months to reassess. If he continues to have symptoms, further testing  will be warranted. Patient is agreeable to this plan and will call if any problems develop in the interim.

## 2011-09-18 ENCOUNTER — Ambulatory Visit: Payer: BC Managed Care – PPO | Admitting: Cardiology

## 2012-01-06 HISTORY — PX: COLONOSCOPY: SHX174

## 2012-02-08 ENCOUNTER — Ambulatory Visit: Payer: 59 | Admitting: Internal Medicine

## 2012-02-08 ENCOUNTER — Telehealth: Payer: Self-pay | Admitting: Family Medicine

## 2012-02-08 ENCOUNTER — Ambulatory Visit: Payer: 59

## 2012-02-08 VITALS — BP 147/82 | HR 102 | Temp 98.1°F | Resp 18 | Wt 241.0 lb

## 2012-02-08 DIAGNOSIS — R059 Cough, unspecified: Secondary | ICD-10-CM

## 2012-02-08 DIAGNOSIS — R0902 Hypoxemia: Secondary | ICD-10-CM

## 2012-02-08 DIAGNOSIS — J189 Pneumonia, unspecified organism: Secondary | ICD-10-CM

## 2012-02-08 DIAGNOSIS — R05 Cough: Secondary | ICD-10-CM

## 2012-02-08 LAB — POCT CBC
Granulocyte percent: 70.5 %G (ref 37–80)
Hemoglobin: 14.7 g/dL (ref 14.1–18.1)
MCV: 90.8 fL (ref 80–97)
MID (cbc): 0.9 (ref 0–0.9)
MPV: 8.3 fL (ref 0–99.8)
Platelet Count, POC: 322 10*3/uL (ref 142–424)
RBC: 4.83 M/uL (ref 4.69–6.13)

## 2012-02-08 MED ORDER — ALBUTEROL SULFATE (2.5 MG/3ML) 0.083% IN NEBU: 2.5000 mg | INHALATION_SOLUTION | Freq: Four times a day (QID) | RESPIRATORY_TRACT | Status: DC | PRN

## 2012-02-08 MED ORDER — AZITHROMYCIN 500 MG PO TABS: 500.0000 mg | ORAL_TABLET | Freq: Every day | ORAL | Status: DC

## 2012-02-08 MED ORDER — ALBUTEROL SULFATE HFA 108 (90 BASE) MCG/ACT IN AERS: 2.0000 | INHALATION_SPRAY | Freq: Four times a day (QID) | RESPIRATORY_TRACT | Status: DC | PRN

## 2012-02-08 MED ORDER — ALBUTEROL SULFATE (2.5 MG/3ML) 0.083% IN NEBU
2.5000 mg | INHALATION_SOLUTION | Freq: Once | RESPIRATORY_TRACT | Status: AC
  Administered 2012-02-08: 2.5 mg via RESPIRATORY_TRACT

## 2012-02-08 MED ORDER — IPRATROPIUM BROMIDE 0.02 % IN SOLN
0.5000 mg | Freq: Once | RESPIRATORY_TRACT | Status: AC
  Administered 2012-02-08: 0.5 mg via RESPIRATORY_TRACT

## 2012-02-08 NOTE — Telephone Encounter (Signed)
Called [facility] at [address] and spoke to Pharmacist- Called in Ventolin [facility] inhaler and Zithromax as directed per Dr. Mindi Junker. The Pharmacist informed the Pt had just been up there about 5 minutes ago and stated it wasn't a big deal- he would come back in the morning for the medicine. I also called the Pt and LMOM (cell) and apologized for the miscommunication between the Dr. and I. I instructed him to call the office if he had any questions or concerns and to feel better soon.   At triage, the wrong pharmacy was put in- [facility]. Dr. Mindi Junker originally prescribed meds to [facility]- then the other Pharmacy in the Pt's chart was [facility]. I then put in [facility] in as his 3rd pharmacy, wrote him an out of work note and walked him to check out. In the meantime, I forgot to call in his scripts to [facility]. After I got home tonight, I remembered and called the pharmacy and Pt. (all of this I did from my home laptop)

## 2012-02-08 NOTE — Addendum Note (Signed)
Addended by: Glennie Isle on: 02/08/2012 09:08 PM   Modules accepted: Orders

## 2012-02-08 NOTE — Progress Notes (Signed)
  Subjective:    Patient ID: Benjamin Conner, male    DOB: [birth year], 56 y.o.   MRN: [id number]  HPI Cough shortness of breath Chills fever  onset 1 week ago Sorethroat, losing his voice, hard to swollow Former smoker  weakness   Review of Systems  Constitutional: Positive for fever, chills, diaphoresis, activity change and fatigue.  HENT: Positive for sore throat, trouble swallowing and voice change.   Eyes: Negative.   Respiratory: Positive for cough and shortness of breath.   Cardiovascular: Negative.   Gastrointestinal: Negative.   Genitourinary: Negative.   Musculoskeletal: Negative.   Neurological: Negative.   Hematological: Negative.   Psychiatric/Behavioral: Negative.   All other systems reviewed and are negative.       Objective:   Physical Exam  Nursing note and vitals reviewed. Constitutional: He is oriented to person, place, and time. He appears well-developed and well-nourished.  HENT:  Head: Normocephalic and atraumatic.  Right Ear: External ear normal.  Left Ear: External ear normal.  Nose: Nose normal.       Pharyngeal erythema and swelling no exudate  Eyes: Conjunctivae normal and EOM are normal. Pupils are equal, round, and reactive to light.  Neck: Normal range of motion. Neck supple.  Cardiovascular: Normal rate, regular rhythm and normal heart sounds.   Pulmonary/Chest: Effort normal. He has wheezes.       Decreased air entry  Abdominal: Soft. Bowel sounds are normal.  Musculoskeletal: Normal range of motion.  Neurological: He is alert and oriented to person, place, and time.  Skin: Skin is dry.  Psychiatric: He has a normal mood and affect. His behavior is normal. Judgment and thought content normal.    Results for orders placed in visit on 02/08/12  POCT CBC      Component Value Range   WBC 13.7 (*) 4.6 - 10.2 K/uL   Lymph, poc 3.1  0.6 - 3.4   POC LYMPH PERCENT 22.8  10 - 50 %L   MID (cbc) 0.9  0 - 0.9   POC MID % 6.7  0 - 12 %M   POC  Granulocyte 9.7 (*) 2 - 6.9   Granulocyte percent 70.5  37 - 80 %G   RBC 4.83  4.69 - 6.13 M/uL   Hemoglobin 14.7  14.1 - 18.1 g/dL   HCT, POC 78.2  95.6 - 53.7 %   MCV 90.8  80 - 97 fL   MCH, POC 30.4  27 - 31.2 pg   MCHC 33.5  31.8 - 35.4 g/dL   RDW, POC 21.3     Platelet Count, POC 322  142 - 424 K/uL   MPV 8.3  0 - 99.8 fL    elevated wbd   Assessment & Plan:  Cough fever chills previous pneumonia with pharngitis.  Elevated wbc. Sat 96 percent. Will give bronchodilator rx in the office.will rx with appropriate antibiotic.

## 2012-02-08 NOTE — Patient Instructions (Addendum)
Take meds as directed. Return t the office if you are not improving in 24 hours.

## 2012-02-12 ENCOUNTER — Telehealth: Payer: Self-pay

## 2012-02-12 NOTE — Telephone Encounter (Signed)
Received a call from [facility] asking for permission to substitute Pro-Air HFA for the Ventolin Dr Mindi Junker had sent in. Since Dr Mindi Junker just ordered for albuterol inhaler, gave permission to change, but then pharmacist requested change to 3 inhalers since mail order. I'm not sure that Dr Mindi Junker intended pt to cont using inhaler since it was rxd for an acute problem. Pharmacist did check and sees a paid claim for inhaler from local pharmacy.  LMOM for pt to CB. Check status of pt and make sure that he got his inhaler. The Rx is on hold at [facility] for just the one inhaler. Pt can call them to release it if he still needs this.

## 2012-02-15 NOTE — Telephone Encounter (Signed)
Called patient again, to see if he has gotten inhaler. Left message for him to call back.

## 2012-02-17 ENCOUNTER — Encounter: Payer: Self-pay | Admitting: Radiology

## 2012-02-24 ENCOUNTER — Ambulatory Visit: Payer: 59 | Admitting: Family Medicine

## 2012-02-24 VITALS — BP 136/86 | HR 104 | Temp 97.9°F | Resp 16 | Ht 75.5 in | Wt 241.8 lb

## 2012-02-24 DIAGNOSIS — J189 Pneumonia, unspecified organism: Secondary | ICD-10-CM

## 2012-02-24 DIAGNOSIS — R05 Cough: Secondary | ICD-10-CM

## 2012-02-24 DIAGNOSIS — R064 Hyperventilation: Secondary | ICD-10-CM

## 2012-02-24 DIAGNOSIS — R059 Cough, unspecified: Secondary | ICD-10-CM

## 2012-02-24 DIAGNOSIS — J019 Acute sinusitis, unspecified: Secondary | ICD-10-CM

## 2012-02-24 MED ORDER — HYDROCODONE-HOMATROPINE 5-1.5 MG/5ML PO SYRP: 5.0000 mL | ORAL_SOLUTION | Freq: Every evening | ORAL | Status: DC | PRN

## 2012-02-24 MED ORDER — LEVOFLOXACIN 500 MG PO TABS: 500.0000 mg | ORAL_TABLET | Freq: Every day | ORAL | Status: DC

## 2012-02-24 MED ORDER — FLUTICASONE PROPIONATE 50 MCG/ACT NA SUSP: 2.0000 | Freq: Every day | NASAL | Status: DC

## 2012-02-24 NOTE — Progress Notes (Signed)
[facility]:  Office Visit  Chief Complaint:  Chief Complaint  Patient presents with  . Cough  . Sinusitis  . chest congestion    HPI: Benjamin Conner is a 57 y.o. male who complains of  Cough, fever, chills, SOB; pleuritic CP. Was dx with lingular PNA on 02/08/12. Was on Azithromax 500 mg x 5 days. Not improved. Taken Albuterol and flonase with some relief. Taking  Mucinex.   Past Medical History  Diagnosis Date  . HYPERTENSION   . GERD   . PEPTIC ULCER DISEASE   . HYPERTROPHY PROSTATE W/UR OBST & OTH LUTS   . OSTEOARTHRITIS   . BACK PAIN   . Hyperlipidemia   . Pneumonia    Past Surgical History  Procedure Laterality Date  . Appendectomy     History   Social History  . Marital Status: Married    Spouse Name: N/A    Number of Children: 2  . Years of Education: N/A   Occupational History  .      Cashier   Social History Main Topics  . Smoking status: Former Games developer  . Smokeless tobacco: None  . Alcohol Use: Yes     Comment: Occasional  . Drug Use: None  . Sexually Active: None   Other Topics Concern  . None   Social History Narrative  . None   Family History  Problem Relation Age of Onset  . Heart disease Father   . Coronary artery disease Mother     Stent at age 69   Allergies  Allergen Reactions  . Asa (Aspirin)    Prior to Admission medications   Medication Sig Start Date End Date Taking? Authorizing Provider  albuterol (PROVENTIL HFA;VENTOLIN HFA) 108 (90 BASE) MCG/ACT inhaler Inhale 2 puffs into the lungs every 6 (six) hours as needed for wheezing. 02/08/12  Yes Sherlyn Hay, MD  aspirin 81 MG tablet Take 81 mg by mouth daily.   Yes Historical Provider, MD  cyclobenzaprine (FLEXERIL) 5 MG tablet Take 5 mg by mouth 3 (three) times daily as needed.   Yes Historical Provider, MD  esomeprazole (NEXIUM) 40 MG capsule Take 1 capsule (40 mg total) by mouth daily before breakfast. 05/27/11  Yes Marzella Schlein McClung, PA-C  fexofenadine  (ALLEGRA) 180 MG tablet Take 180 mg by mouth daily.   Yes Historical Provider, MD  gabapentin (NEURONTIN) 300 MG capsule Take 600 mg by mouth 2 (two) times daily.   Yes Historical Provider, MD  meloxicam (MOBIC) 15 MG tablet Take 1 tablet (15 mg total) by mouth daily. 05/27/11  Yes Anders Simmonds, PA-C  Omega-3 Fatty Acids (FISH OIL PO) Take 1 tablet by mouth daily.   Yes Historical Provider, MD  solifenacin (VESICARE) 5 MG tablet Take 10 mg by mouth daily.   Yes Historical Provider, MD  tamoxifen (NOLVADEX) 10 MG tablet Take 10 mg by mouth 2 (two) times daily.   Yes Historical Provider, MD  valsartan-hydrochlorothiazide (DIOVAN-HCT) 160-25 MG per tablet Take 1 tablet by mouth daily. 05/27/11  Yes Marzella Schlein McClung, PA-C  azithromycin (ZITHROMAX) 500 MG tablet Take 1 tablet (500 mg total) by mouth daily. 02/08/12   Sherlyn Hay, MD  fluticasone (FLONASE) 50 MCG/ACT nasal spray Place 2 sprays into the nose daily. 05/27/11 05/26/12  Anders Simmonds, PA-C     ROS: The patient denies night sweats, unintentional weight loss,  nausea, vomiting, abdominal pain, dysuria, hematuria, melena, numbness, weakness, or tingling.  All  other systems have been reviewed and were otherwise negative with the exception of those mentioned in the HPI and as above.    PHYSICAL EXAM: Filed Vitals:   02/24/12 1142  BP: 136/86  Pulse: 104  Temp: 97.9 F (36.6 C)  Resp: 16   Filed Vitals:   02/24/12 1142  Height: 6' 3.5" (1.918 m)  Weight: 241 lb 12.8 oz (109.68 kg)   Body mass index is 29.81 kg/(m^2).  General: Alert, tired appearing HEENT:  Normocephalic, atraumatic, oropharynx patent. TM nl. NO exudates. + erythema throat. + sinus tenderness. Cardiovascular:  Regular rate and rhythm, no rubs murmurs or gallops.  No Carotid bruits, radial pulse intact. No pedal edema.  Respiratory: Clear to auscultation bilaterally.  No wheezes, rales, or rhonchi.  No cyanosis, no use of accessory musculature GI: No  organomegaly, abdomen is soft and non-tender, positive bowel sounds.  No masses. Skin: No rashes. Neurologic: Facial musculature symmetric. Psychiatric: Patient is appropriate throughout our interaction. Lymphatic: No cervical lymphadenopathy Musculoskeletal: Gait intact.   LABS: Results for orders placed in visit on 02/08/12  POCT CBC      Result Value Range   WBC 13.7 (*) 4.6 - 10.2 K/uL   Lymph, poc 3.1  0.6 - 3.4   POC LYMPH PERCENT 22.8  10 - 50 %L   MID (cbc) 0.9  0 - 0.9   POC MID % 6.7  0 - 12 %M   POC Granulocyte 9.7 (*) 2 - 6.9   Granulocyte percent 70.5  37 - 80 %G   RBC 4.83  4.69 - 6.13 M/uL   Hemoglobin 14.7  14.1 - 18.1 g/dL   HCT, POC 13.0  86.5 - 53.7 %   MCV 90.8  80 - 97 fL   MCH, POC 30.4  27 - 31.2 pg   MCHC 33.5  31.8 - 35.4 g/dL   RDW, POC 78.4     Platelet Count, POC 322  142 - 424 K/uL   MPV 8.3  0 - 99.8 fL     EKG/XRAY:   Primary read interpreted by Dr. Conley Rolls at [facility].   ASSESSMENT/PLAN: Encounter Diagnoses  Name Primary?  . Acute sinusitis Yes  . Pneumonia   . Cough    57 y/o male with h/o lingula PNA dx on 02/08/12, was placed on Azithromycin 500 mg x 5 days, symptomatically not improved.  Rx Levaquin 500 mg x 7 days Rx Albuterol Rx Hydromet Rx Flonase F/u 4 weeks after done with abx. To make sure that xray clear of PNA.  Note for work for 5 days off.     Hamilton Capri [place], DO 02/24/2012 12:55 PM

## 2012-03-10 NOTE — Telephone Encounter (Signed)
Since we have been unable to contact pt, I called [facility] to check to see if pt did p/up an albuterol inhaler and was advised that he p/up Ventolin on 02/09/12.

## 2012-04-29 ENCOUNTER — Other Ambulatory Visit: Payer: Self-pay | Admitting: Nurse Practitioner

## 2012-05-24 ENCOUNTER — Other Ambulatory Visit: Payer: Self-pay | Admitting: Nurse Practitioner

## 2012-06-01 ENCOUNTER — Other Ambulatory Visit: Payer: Self-pay | Admitting: Physician Assistant

## 2012-07-25 ENCOUNTER — Ambulatory Visit (INDEPENDENT_AMBULATORY_CARE_PROVIDER_SITE_OTHER): Payer: 59 | Admitting: Family Medicine

## 2012-07-25 ENCOUNTER — Encounter: Payer: Self-pay | Admitting: Physician Assistant

## 2012-07-25 VITALS — BP 138/92 | HR 72 | Temp 98.4°F | Resp 16 | Ht 74.0 in | Wt 241.2 lb

## 2012-07-25 DIAGNOSIS — Z0000 Encounter for general adult medical examination without abnormal findings: Secondary | ICD-10-CM

## 2012-07-25 DIAGNOSIS — M1990 Unspecified osteoarthritis, unspecified site: Secondary | ICD-10-CM

## 2012-07-25 DIAGNOSIS — K219 Gastro-esophageal reflux disease without esophagitis: Secondary | ICD-10-CM

## 2012-07-25 DIAGNOSIS — IMO0001 Reserved for inherently not codable concepts without codable children: Secondary | ICD-10-CM

## 2012-07-25 DIAGNOSIS — R0602 Shortness of breath: Secondary | ICD-10-CM

## 2012-07-25 DIAGNOSIS — R0683 Snoring: Secondary | ICD-10-CM

## 2012-07-25 DIAGNOSIS — I10 Essential (primary) hypertension: Secondary | ICD-10-CM

## 2012-07-25 DIAGNOSIS — J189 Pneumonia, unspecified organism: Secondary | ICD-10-CM

## 2012-07-25 DIAGNOSIS — N400 Enlarged prostate without lower urinary tract symptoms: Secondary | ICD-10-CM

## 2012-07-25 DIAGNOSIS — J019 Acute sinusitis, unspecified: Secondary | ICD-10-CM

## 2012-07-25 LAB — COMPREHENSIVE METABOLIC PANEL
AST: 31 U/L (ref 0–37)
Albumin: 4.4 g/dL (ref 3.5–5.2)
Alkaline Phosphatase: 73 U/L (ref 39–117)
BUN: 21 mg/dL (ref 6–23)
Potassium: 4.1 mEq/L (ref 3.5–5.3)
Sodium: 137 mEq/L (ref 135–145)
Total Bilirubin: 0.7 mg/dL (ref 0.3–1.2)

## 2012-07-25 LAB — POCT URINALYSIS DIPSTICK
Nitrite, UA: NEGATIVE
Protein, UA: NEGATIVE
pH, UA: 6

## 2012-07-25 LAB — POCT UA - MICROSCOPIC ONLY
Crystals, Ur, HPF, POC: NEGATIVE
Yeast, UA: NEGATIVE

## 2012-07-25 LAB — LIPID PANEL
LDL Cholesterol: 128 mg/dL — ABNORMAL HIGH (ref 0–99)
Total CHOL/HDL Ratio: 5 Ratio
Triglycerides: 250 mg/dL — ABNORMAL HIGH (ref <150)
VLDL: 50 mg/dL — ABNORMAL HIGH (ref 0–40)

## 2012-07-25 MED ORDER — ALBUTEROL SULFATE HFA 108 (90 BASE) MCG/ACT IN AERS: 2.0000 | INHALATION_SPRAY | Freq: Four times a day (QID) | RESPIRATORY_TRACT | Status: DC | PRN

## 2012-07-25 MED ORDER — VALSARTAN-HYDROCHLOROTHIAZIDE 160-25 MG PO TABS: 1.0000 | ORAL_TABLET | Freq: Every day | ORAL | Status: DC

## 2012-07-25 MED ORDER — MELOXICAM 15 MG PO TABS: 15.0000 mg | ORAL_TABLET | Freq: Every day | ORAL | Status: DC

## 2012-07-25 MED ORDER — SOLIFENACIN SUCCINATE 5 MG PO TABS: 10.0000 mg | ORAL_TABLET | Freq: Every day | ORAL | Status: DC

## 2012-07-25 MED ORDER — FLUTICASONE PROPIONATE 50 MCG/ACT NA SUSP: 2.0000 | Freq: Every day | NASAL | Status: DC

## 2012-07-25 MED ORDER — CYCLOBENZAPRINE HCL 5 MG PO TABS: ORAL_TABLET | ORAL | Status: DC

## 2012-07-25 MED ORDER — TAMSULOSIN HCL 0.4 MG PO CAPS: 0.4000 mg | ORAL_CAPSULE | Freq: Every day | ORAL | Status: DC

## 2012-07-25 MED ORDER — ESOMEPRAZOLE MAGNESIUM 40 MG PO CPDR: 40.0000 mg | DELAYED_RELEASE_CAPSULE | Freq: Every day | ORAL | Status: DC

## 2012-07-25 MED ORDER — GABAPENTIN 300 MG PO CAPS: ORAL_CAPSULE | ORAL | Status: DC

## 2012-07-25 NOTE — Progress Notes (Signed)
  Subjective:    Patient ID: Benjamin Conner, male    DOB: [birth year], 56 y.o.   MRN: [id number]  HPI    Review of Systems  Constitutional: Positive for diaphoresis, fatigue and unexpected weight change.  HENT: Positive for congestion, sneezing, neck pain, neck stiffness, postnasal drip and sinus pressure.   Eyes: Negative.   Respiratory: Positive for cough and shortness of breath.   Cardiovascular: Negative.   Gastrointestinal: Negative.   Endocrine: Negative.   Genitourinary: Positive for frequency.  Musculoskeletal: Positive for back pain and arthralgias.  Skin: Negative.   Allergic/Immunologic: Negative.   Neurological: Positive for dizziness and light-headedness.  Hematological: Negative.   Psychiatric/Behavioral: Positive for sleep disturbance.       Objective:   Physical Exam        Assessment & Plan:

## 2012-07-25 NOTE — Progress Notes (Signed)
Patient ID: Benjamin Conner MRN: [id number], DOB: 10/26/1955 57 y.o. Date of Encounter: 07/25/2012, 6:34 PM  Primary Physician: Sanda Linger, MD  Chief Complaint: Physical (CPE)  HPI: 57 y.o. male with history noted below here for CPE. Doing well.   1) Hypertension: Doing well. Tolerating Diovan-HCT 160-25 mg daily. Needs refill. No adverse effects.   2) History of chest pain: Last saw [facility] June 2013. Had echo 05/29/2011 with EF of 55-60%. Mild LVH.  Grade 1 diastolic dysfunction. Mitral valve: calcified annulus. Left atrium: mildly dilated. Atrial septum: atrial septal aneurysm. Had exercise tolerance test 06/19/2011 that revealed poor exercise tolerance. EKG was negative for ischemia. He was not on an active exercise program. He was encouraged to start a walking program. He was advised to RTC in 3 months to reassess. If he was continuing to have symptoms at that time further testing would be warranted. Patient did not follow up with [facility] at that time.  Notes some increased SOB, fatigue, and daytime tiredness. States that he will have the same chest pain that he was having last year about once a week and either burp or pass gas and it will relieve the pain. The pain is not exertional. No diaphoresis. No nausea or vomiting associated. No radiation to the neck, shoulder, or arm.   He also states a long history of snoring and seems to have woken himself up a few times choking, or trying to catch himself breathing. Never with a sleep apnea evaluation.     3) Reflux: Well controlled on Nexium 40 mg daily. No adverse effects. Requests refill.   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 Followed by Ms. Daphine Deutscher, PA-C at [facility]. Has appointment next month. Patient with previous history of paresthesia involving part of the left hand/arm. This is now worsening. No weakness. No pain.     5) CPE: Colonoscopy 2009. Received 10 year follow up per patient report.   Review of  Systems: See assistant note.    Past Medical History  Diagnosis Date  . HYPERTENSION   . GERD   . PEPTIC ULCER DISEASE   . HYPERTROPHY PROSTATE W/UR OBST & OTH LUTS   . OSTEOARTHRITIS   . BACK PAIN   . Hyperlipidemia   . Pneumonia      Past Surgical History  Procedure Laterality Date  . Appendectomy      Home Meds:  Prior to Admission medications   Medication Sig Start Date End Date Taking? Authorizing Provider  albuterol (PROVENTIL HFA;VENTOLIN HFA) 108 (90 BASE) MCG/ACT inhaler Inhale 2 puffs into the lungs every 6 (six) hours as needed for wheezing. 02/08/12   Sherlyn Hay, MD  aspirin 81 MG tablet Take 81 mg by mouth daily.    Historical Provider, MD  cyclobenzaprine (FLEXERIL) 5 MG tablet TAKE 1 TABLET TWICE A DAY 04/29/12   Suanne Marker, MD  esomeprazole (NEXIUM) 40 MG capsule Take 1 capsule (40 mg total) by mouth daily before breakfast. 05/27/11   Anders Simmonds, PA-C  fexofenadine (ALLEGRA) 180 MG tablet Take 180 mg by mouth daily.    Historical Provider, MD  fluticasone (FLONASE) 50 MCG/ACT nasal spray Place 2 sprays into the nose daily. 02/24/12 02/23/13  Thao P Le, DO  gabapentin (NEURONTIN) 300 MG capsule TAKE 2 CAPSULES TWICE A DAY 05/24/12   Nilda Riggs, NP                meloxicam [facility]) 15  MG tablet Take 1 tablet (15 mg total) by mouth daily. 05/27/11   Anders Simmonds, PA-C  Omega-3 Fatty Acids (FISH OIL PO) Take 1 tablet by mouth daily.    Historical Provider, MD  solifenacin (VESICARE) 5 MG tablet Take 10 mg by mouth daily.    Historical Provider, MD         valsartan-hydrochlorothiazide (DIOVAN-HCT) 160-25 MG per tablet TAKE 1 TABLET DAILY 06/01/12   Sondra Barges, PA-C    Allergies:  Allergies  Allergen Reactions  . Asa (Aspirin)     History   Social History  . Marital Status: Married    Spouse Name: N/A    Number of Children: 2  . Years of Education: N/A   Occupational History  . Archivist   Social History Main  Topics  . Smoking status: Former Games developer  . Smokeless tobacco: Not on file  . Alcohol Use: Yes     Comment: 4-6 drinks  . Drug Use: No  . Sexually Active: Yes   Other Topics Concern  . Not on file   Social History Narrative   Married. Education: [employer].     Family History  Problem Relation Age of Onset  . Heart disease Father   . Coronary artery disease Mother     Stent at age 31  . Hypertension Brother   . Diabetes Brother     Physical Exam: Blood pressure 138/92, pulse 72, temperature 98.4 F (36.9 C), temperature source Oral, resp. rate 16, height 6\' 2"  (1.88 m), weight 241 lb 3.2 oz (109.408 kg), SpO2 97.00%.  General: Well developed, well nourished, in no acute distress. HEENT: Normocephalic, atraumatic. Conjunctiva pink, sclera non-icteric. Pupils 2 mm constricting to 1 mm, round, regular, and equally reactive to light and accomodation. EOMI. Internal auditory canal clear. TMs with good cone of light and without pathology. Nasal mucosa pink. Nares are without discharge. No sinus tenderness. Oral mucosa pink. Dentition poor. Pharynx without exudate.   Neck: Supple. Trachea midline. No thyromegaly. Full ROM. No lymphadenopathy. Lungs: Clear to auscultation bilaterally without wheezes, rales, or rhonchi. Breathing is of normal effort and unlabored. Cardiovascular: RRR with S1 S2. No murmurs, rubs, or gallops appreciated. Distal pulses 2+ symmetrically. No carotid or abdominal bruits. Abdomen: Soft, non-tender, non-distended with normoactive bowel sounds. No hepatosplenomegaly or masses. No rebound/guarding. No CVA tenderness. Without hernias.  Rectal: No external hemorrhoids or fissures. Rectal vault without masses. Prostate mildly enlarged, smooth, without lesions, or TTP.   Genitourinary: Circumcised male. No penile lesions. Testes descended bilaterally, and smooth without tenderness or masses. No hernias.  Musculoskeletal: Full range of motion and 5/5 strength throughout.  Without swelling, atrophy, tenderness, crepitus, or warmth. Extremities without clubbing, cyanosis, or edema. Calves supple. Skin: Warm and moist without erythema, ecchymosis, wounds, or rash. Neuro: A+Ox3. CN II-XII grossly intact. Moves all extremities spontaneously. Full sensation throughout. Normal gait. DTR 2+ throughout upper and lower extremities. Finger to nose intact. Psych:  Responds to questions appropriately with a normal affect.   Studies:  Results for orders placed in visit on 07/25/12  POCT UA - MICROSCOPIC ONLY      Result Value Range   WBC, Ur, HPF, POC 4-10     RBC, urine, microscopic 1-2     Bacteria, U Microscopic 1+     Mucus, UA negative     Epithelial cells, urine per micros 0-2     Crystals, Ur, HPF, POC negative  Casts, Ur, LPF, POC negative     Yeast, UA negative    POCT URINALYSIS DIPSTICK      Result Value Range   Color, UA yellow     Clarity, UA clear     Glucose, UA neg     Bilirubin, UA bneg     Ketones, UA neg     Spec Grav, UA 1.020     Blood, UA trace-lysed     pH, UA 6.0     Protein, UA neg     Urobilinogen, UA 0.2     Nitrite, UA neg     Leukocytes, UA small (1+)    IFOBT (OCCULT BLOOD)      Result Value Range   IFOBT Positive       CBC, CMET, Lipid, PSA, TSH all pending. Patient is fasting.  EKG:NSR  Assessment/Plan:  57 y.o. male here for CPE with hypertension, history of chest pain, now with SOB,snoring,fatigue, dizziness, DJD, paresthesias, enlarged prostate, and reflux  1. Hypertension -Diovan/HCTZ 160/25 mg 1 po daily #30 no RF -Diovan/HCTZ 160/25 mg 1 po daily #90 RF 3 -Healthy diet -Exercise -Weight loss  2. History of chest pain, now with SOB/snoring/fatigue -Advised patient that he was to follow up with cardiology 3 months after his stress test. Advised patient to call [facility] and schedule an appointment for follow up and further evaluation. He agrees to do so.  -His dizziness has not worsened. Recommend  cardiology evaluation.  -Will also obtain sleep study -ER precautions  3. Reflux -Well controlled -Nexium 40 mg 1 po daily #90 RF 3  4. CPE -Colonoscopy 2009. Patient reports this was normal and that he was given a follow window of 10 years. However his Hemasure was positive today. I will go ahead and re-refer him back to GI to have them re-evaluate him based on this finding.  -History of mildly enlarged prostate: followed by urology  -Flomax 0.4 mg 1 po qhs #90 RF 3  -Vesicare 10 mg 1 po daily #90 RF 3  -Follow up with urology as directed -DJD of the neck with paresthesia : Followed by [facility]  -No weakness of the upper extremities   -Has appointment next month  -Continue current treatment at this time  -gabapentin 300 mg 2 po bid #360 RF 0  -Flexeril 5 mg 2 po bid #360 RF 1  Signed, Eula Listen, PA-C 07/25/2012 6:34 PM

## 2012-07-26 NOTE — Progress Notes (Signed)
EKG read and patient discussed with Ryan Dunn, PA-C. Agree with assessment and plan of care per his note.   

## 2012-07-27 ENCOUNTER — Other Ambulatory Visit: Payer: Self-pay

## 2012-07-27 DIAGNOSIS — J189 Pneumonia, unspecified organism: Secondary | ICD-10-CM

## 2012-07-27 MED ORDER — ALBUTEROL SULFATE HFA 108 (90 BASE) MCG/ACT IN AERS: 2.0000 | INHALATION_SPRAY | Freq: Four times a day (QID) | RESPIRATORY_TRACT | Status: DC | PRN

## 2012-07-29 ENCOUNTER — Telehealth: Payer: Self-pay | Admitting: Family Medicine

## 2012-07-29 ENCOUNTER — Encounter: Payer: Self-pay | Admitting: Internal Medicine

## 2012-07-29 DIAGNOSIS — E785 Hyperlipidemia, unspecified: Secondary | ICD-10-CM

## 2012-07-29 NOTE — Telephone Encounter (Signed)
Patient would like to start a statin. [facility]

## 2012-07-31 ENCOUNTER — Other Ambulatory Visit: Payer: Self-pay | Admitting: Nurse Practitioner

## 2012-08-01 NOTE — Telephone Encounter (Signed)
Former [facility] patient, has not been assigned new provider.  Auth refill via [facility] 

## 2012-08-02 MED ORDER — ATORVASTATIN CALCIUM 20 MG PO TABS: 20.0000 mg | ORAL_TABLET | Freq: Every day | ORAL | Status: DC

## 2012-08-02 NOTE — Telephone Encounter (Signed)
Lipitor sent in

## 2012-08-11 ENCOUNTER — Encounter: Payer: Self-pay | Admitting: Neurology

## 2012-08-12 ENCOUNTER — Ambulatory Visit (INDEPENDENT_AMBULATORY_CARE_PROVIDER_SITE_OTHER): Payer: 59 | Admitting: Neurology

## 2012-08-12 ENCOUNTER — Encounter: Payer: Self-pay | Admitting: Neurology

## 2012-08-12 VITALS — BP 137/87 | HR 75 | Resp 18 | Ht 77.0 in | Wt 246.0 lb

## 2012-08-12 DIAGNOSIS — R351 Nocturia: Secondary | ICD-10-CM | POA: Insufficient documentation

## 2012-08-12 DIAGNOSIS — J441 Chronic obstructive pulmonary disease with (acute) exacerbation: Secondary | ICD-10-CM

## 2012-08-12 DIAGNOSIS — R0683 Snoring: Secondary | ICD-10-CM

## 2012-08-12 DIAGNOSIS — J42 Unspecified chronic bronchitis: Secondary | ICD-10-CM

## 2012-08-12 DIAGNOSIS — G4733 Obstructive sleep apnea (adult) (pediatric): Secondary | ICD-10-CM | POA: Insufficient documentation

## 2012-08-12 DIAGNOSIS — R0609 Other forms of dyspnea: Secondary | ICD-10-CM

## 2012-08-12 DIAGNOSIS — R0989 Other specified symptoms and signs involving the circulatory and respiratory systems: Secondary | ICD-10-CM

## 2012-08-12 NOTE — Progress Notes (Signed)
[facility]  Provider:  Melvyn Novas, M D  Referring Provider: [name] Primary Care Physician:  Sanda Linger, MD  Chief Complaint  Patient presents with  . Follow-up    Dunn,snoring,r\o OSA,RM 11   NEW PATIENT SLEEP CONSULT .  PATIET USED TO SEE DR.LOVE .  HPI:  Benjamin Conner is a 57 y.o. male  Is seen here as a referral/ revisit  from Dr. Karyl Kinnier. Cystoscopy was is referred today by urgent medical and family care for evaluation of an unspecified sleep disorder. The patient has been known to have a diastolic cardiac dysfunction with mild left ventricular hypertrophy and has been followed by the lower cardiology. He also has a history of an atrial septal aneurysm. Stress test I exercise in June 2013 revealed poor exercise tolerance and the patient was encouraged to start a walking program. No his primary physician is more concerned about increasing shortness of breath daytime fatigue daytime sleepiness as well as the reports that the patient has not trouble initiating sleep and staying asleep at night. Would wake him up seems to be partially nocturia and partially spontaneous arousals he had not had a sleep apnea evaluation in the past please note that there are as additional medical diagnoses of reflux disease which has been controlled with Nexium, he has been on 2 will medications to control nocturia and urine floor, and he had in the remote past seen Dr. Sandria Manly for paresthesias. Please also note that in he has a history of degenerative disc disease in the neck and lower back.  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 He works alternating 2 nights or 2 days in [employer] and then 3  counter shift. 2 PM to 10 PM, versus 5 AM to 1 PM, suboptimal hours after his shift clauses before he can wind down to go to bed , she has an average of 6 hours. He has more trouble with daytime sleep after the early shift than with  night  shift .  Patient is a brother was diagnosed always a older than him. His father was known to snore but was not diagnosed with  OSA.     Please not that he also had acute sinusitis and pneumonia in the past but was not diagnosed with a primary lung disease over. The patient is on a bronchodilator prescribed by urgent medical care and has Flonase nasal spray. He is unsure if he has apnea, but he was sure that he snores.   Versus and was at 12 points are mild fatigue severity score is 27 point,  depression score     .    Review of Systems: Out of a complete 14 system review, the patient complains of only the following symptoms, and all other reviewed systems are negative. Sleepiness, fatigue, snoring, nocturia. Leg pain.   History   Social History  . Marital Status: Married    Spouse Name: [name]    Number of Children: 2  . Years of Education: 12   Occupational History  . Cashier     [employer]   Social History Main Topics  . Smoking status: Former Games developer  . Smokeless tobacco: Not on file     Comment: quit 8\2009  . Alcohol Use: Yes     Comment: 2-3 daily  . Drug Use: No  . Sexually Active: Yes   Other Topics Concern  . Not on  file   Social History Narrative   Married. Education: [employer].     Family History  Problem Relation Age of Onset  . Heart disease Father   . Coronary artery disease Mother     Stent at age 20  . Hypertension Mother   . High Cholesterol Mother   . Hypertension Brother   . Diabetes Brother     Past Medical History  Diagnosis Date  . HYPERTENSION   . GERD   . PEPTIC ULCER DISEASE   . HYPERTROPHY PROSTATE W/UR OBST & OTH LUTS   . OSTEOARTHRITIS   . BACK PAIN     chronic  . Hyperlipidemia   . Pneumonia   . Numbness of hand     since 2005  . Snoring disorder   . Nocturia     Past Surgical History  Procedure Laterality Date  . Appendectomy  1987    Current Outpatient Prescriptions  Medication Sig Dispense Refill  .  albuterol (PROVENTIL HFA;VENTOLIN HFA) 108 (90 BASE) MCG/ACT inhaler Inhale 2 puffs into the lungs every 6 (six) hours as needed for wheezing.  1 Inhaler  1  . aspirin 81 MG tablet Take 81 mg by mouth daily.      Marland Kitchen atorvastatin (LIPITOR) 20 MG tablet Take 1 tablet (20 mg total) by mouth daily.  90 tablet  1  . cyclobenzaprine (FLEXERIL) 5 MG tablet Take 2 tabs bid  360 tablet  1  . esomeprazole (NEXIUM) 40 MG capsule Take 1 capsule (40 mg total) by mouth daily before breakfast.  90 capsule  3  . fexofenadine (ALLEGRA) 180 MG tablet Take 180 mg by mouth daily.      . fluticasone (FLONASE) 50 MCG/ACT nasal spray Place 2 sprays into the nose daily.  48 g  3  . gabapentin (NEURONTIN) 300 MG capsule TAKE 2 CAPSULES TWICE A DAY  360 capsule  0  . meloxicam (MOBIC) 15 MG tablet Take 1 tablet (15 mg total) by mouth daily.  90 tablet  3  . Omega-3 Fatty Acids (FISH OIL PO) Take 1 tablet by mouth daily.      . solifenacin (VESICARE) 5 MG tablet Take 2 tablets (10 mg total) by mouth daily.  90 tablet  3  . tamsulosin (FLOMAX) 0.4 MG CAPS Take 1 capsule (0.4 mg total) by mouth daily.  90 capsule  3  . valsartan-hydrochlorothiazide (DIOVAN-HCT) 160-25 MG per tablet Take 1 tablet by mouth daily.  90 tablet  3   No current facility-administered medications for this visit.    Allergies as of 08/12/2012 - Review Complete 08/12/2012  Allergen Reaction Noted  . Asa (aspirin)  05/27/2011    Vitals: BP 137/87  Pulse 75  Resp 18  Ht 6\' 5"  (1.956 m)  Wt 246 lb (111.585 kg)  BMI 29.17 kg/m2 Last Weight:  Wt Readings from Last 1 Encounters:  08/12/12 246 lb (111.585 kg)   Last Height:   Ht Readings from Last 1 Encounters:  08/12/12 6\' 5"  (1.956 m)   Physical exam:  General: The patient is awake, alert and appears not in acute distress. The patient is well groomed. Head: Normocephalic, atraumatic. Neck is supple. Mallampati 3, neck circumference: 14.5 ,  Dental status : gaps in upper and lower teeth.   Cardiovascular:  Regular rate and rhythm, without  murmurs or carotid bruit, and without distended neck veins. Respiratory:  He has a prolonged period of normal meal in February, I can hear his wheezing audibly with  old auscultation, and he has recurrent bronchitis-sinusitis Skin:  Without evidence of edema, or rash Trunk: BMI is elevated and patient  has normal posture.  Neurologic exam : The patient is awake and alert, oriented to place and time.  Memory subjective  described as intact. There is a normal attention span & concentration ability. Speech is fluent without dysarthria, dysphonia or aphasia. Mood and affect are appropriate.  Cranial nerves: Pupils are equal and briskly reactive to light. Funduscopic exam without  evidence of pallor or edema. Extraocular movements  in vertical and horizontal planes intact and without nystagmus. Visual fields by finger perimetry are intact. Hearing to finger rub intact.  Facial sensation intact to fine touch. Facial motor strength is symmetric and tongue and uvula move midline.  Motor exam: Normal tone and normal muscle bulk and symmetric normal strength in upper extremities.  He has a foot drop on the left side incomplete, and has used a brace AFO in the past, recommended by [employer] Recur expected grip strength in both hands in comparison to his peer group   Sensory:  Fine touch, pinprick and vibration were tested in all extremities. He has lost fine filament touch and vibration sense on the medial right foot and medial ankle. Vibration was preserved at the knee level. Proprioception is  normal.  Coordination: Rapid alternating movements in the fingers/hands is tested and normal. Finger-to-nose maneuver with  ataxia, dysmetria , no  tremor.   Gait and station: Patient walks without assistive device  Stance is stable and normal.   Tandem gait is  unfragmented. Romberg testing is normal.  Deep tendon reflexes: in the  upper and lower  extremities are symmetric and intact. Babinski maneuver response is  downgoing.   Assessment:  After physical and neurologic examination, review of laboratory studies,  and pre-existing records,   This patient will be worked up for possible sleep apnea. COPD/ASTHMA and shift work.   Plan:  Treatment plan and additional workup : Attended sleep studies based on the patient's comorbidities of recent airway infection, bronchitis and pneumonia in associated with his baseline wheezing-  He does have inhaler, uses the medication daily, but is permitted to use it up to 3 times daily. Based on his shift work schedule, we will invite him for a sleep study based on his needs. He is off work on Fridays and Saturdays and would prefer these days for testing.

## 2012-08-12 NOTE — Patient Instructions (Signed)
Polysomnography (Sleep Studies) Polysomnography (PSG) is a series of tests used for detecting (diagnosing) obstructive sleep apnea and other sleep disorders. The tests measure how some parts of your body are working while you are sleeping. The tests are extensive and expensive. They are done in a sleep lab or hospital, and vary from center to center. Your caregiver may perform other more simple sleep studies and questionnaires before doing more complete and involved testing. Testing may not be covered by insurance. Some of these tests are:  An EEG (Electroencephalogram). This tests your brain waves and stages of sleep.  An EOG (Electrooculogram). This measures the movements of your eyes. It detects periods of REM (rapid eye movement) sleep, which is your dream sleep.  An EKG (Electrocardiogram). This measures your heart rhythm.  EMG (Electromyography). This is a measurement of how the muscles are working in your upper airway and your legs while sleeping.  An oximetry measurement. It measures how much oxygen (air) you are getting while sleeping.  Breathing efforts may be measured. The same test can be interpreted (understood) differently by different caregivers and centers that study sleep.  Studies may be given an apnea/hypopnea index (AHI). This is a number which is found by counting the times of no breathing or under breathing during the night, and relating those numbers to the amount of time spent in bed. When the AHI is greater than 15, the patient is likely to complain of daytime sleepiness. When the AHI is greater than 30, the patient is at increased risk for heart problems and must be followed more closely. Following the AHI also allows you to know how treatment is working. Simple oximetry (tracking the amount of oxygen that is taken in) can be used for screening patients who:  Do not have symptoms (problems) of OSA.  Have a normal Epworth Sleepiness Scale Score.  Have a low pre-test  probability of having OSA.  Have none of the upper airway problems likely to cause apnea.  Oximetry is also used to determine if treatment is effective in patients who showed significant desaturations (not getting enough oxygen) on their home sleep study. One extra measure of safety is to perform additional studies for the person who only snores. This is because no one can predict with absolute certainty who will have OSA. Those who show significant desaturations (not getting enough oxygen) are recommended to have a more detailed sleep study. Document Released: 06/28/2002 Document Revised: 03/16/2011 Document Reviewed: 12/22/2004 [facility] Patient Information 2014 [place], [place]. Chronic Obstructive Pulmonary Disease Chronic obstructive pulmonary disease (COPD) is a lung disease. The lungs become damaged. This makes it hard to get air in and out of your lungs. The damage to your lungs cannot be changed. There are things you can do to improve the lungs and make you feel better. HOME CARE  Take all medicines as told by your doctor.  Use medicines that you breathe in (inhale) as told by your doctor.  Avoid medicines or cough syrups that dry up your airway (antihistamines) and do not allow you to get rid of thick spit.  If you smoke, stop.  Avoid being around smoke, chemicals, and fumes that bother your breathing.  Avoid people that have a catchy (contagious) sickness.  Avoid going outside when it is very hot, cold, or humid.  Use humidifiers in your home and near your bedside if it helps your breathing.  Drink enough fluids to keep your pee (urine) clear or pale yellow.  Eat healthy foods. Eat  smaller meals more often and rest before meals.  Ask your doctor if it is okay to take vitamins or pills with minerals (supplements).  Exercise and stay active.  Rest with activity.  Get into a comfortable position when you have trouble breathing.  Learn and use tips on how to  relax.  Learn and use tips on how to control your breathing as told by your doctor. Try:  Breathing in through your nose for 1 second. Then, breath out (exhale) through your puckered (like a whistle) lips for 2 seconds.  Putting one hand on your belly (abdomen). Breathe in slowly through your nose. Your hand on your belly should move out. Breathe out slowly through your puckered lips. Your hand on your belly should move in as you breathe out.  Learn and use controlled coughing to clear thick spit from your lungs. 1. Lean your head slightly forward. 2. Breathe in deeply. 3. Try to hold your breath for 3 seconds. 4. Keep your mouth slightly open while coughing 2 times. 5. Spit any thick spit out into a tissue. 6. Rest and do the steps again 1 or 2 times as needed.  Get all shots (vaccines) a told by your doctor.  Learn how to manage stress.  Schedule and go to all follow-up doctor visits.  Go to therapy that can help you improve your lungs (pulmonary rehabilitation) as told by your doctor.  Use oxygen at home as told by your doctor. GET HELP RIGHT AWAY IF:   You can feel your heart beating really fast.  You have shortness of breath while resting.  You have shortness of breath that stops you from being able to talk.  You have shortness of breath that stops you from doing normal activities.  You have chest pain lasting longer than 5 minutes.  You start to shake uncontrollably (seizure).  Your family or friends notice that you are flustered or confused.  You cough up more thick spit than usual.  There is a change in the color or thickness of the spit.  Breathing is more difficult than usual.  Your breathing is faster than usual.  Your skin color is more blueish than usual.  You are running out of the medicine you take for breathing.  You are anxious, uneasy, fearful, or restless.  You have a fever. MAKE SURE YOU:   Understand these instructions.  Will watch your  condition.  Will get help right away if you are not doing well or get worse. Document Released: 06/10/2007 Document Revised: 12/09/2011 Document Reviewed: 02/21/2010 [facility] Patient Information 2014 [place], [place]. Asthma Prevention Cigarette smoke, house dust, molds, pollens, animal dander, certain insects, exercise, and even cold air are all triggers that can cause an asthma attack. Often, no specific triggers are identified.  Take the following measures around your house to reduce attacks:  Avoid cigarette and other smoke. No smoking should be allowed in a home where someone with asthma lives. If smoking is allowed indoors, it should be done in a room with a closed door, and a window should be opened to clear the air. If possible, do not use a wood-burning stove, kerosene heater, or fireplace. Minimize exposure to all sources of smoke, including incense, candles, fires, and fireworks.  Decrease pollen exposure. Keep your windows shut and use central air during the pollen allergy season. Stay indoors with windows closed from late morning to afternoon, if you can. Avoid mowing the lawn if you have grass pollen allergy. Change your  clothes and shower after being outside during this time of year.  Remove molds from bathrooms and wet areas. Do this by cleaning the floors with a fungicide or diluted bleach. Avoid using humidifiers, vaporizers, or swamp coolers. These can spread molds through the air. Fix leaky faucets, pipes, or other sources of water that have mold around them.  Decrease house dust exposure. Do this by using bare floors, vacuuming frequently, and changing furnace and air cooler filters frequently. Avoid using feather, wool, or foam bedding. Use polyester pillows and plastic covers over your mattress. Wash bedding weekly in hot water (hotter than 130 F).  Try to get someone else to vacuum for you once or twice a week, if you can. Stay out of rooms while they are being vacuumed and  for a short while afterward. If you vacuum, use a dust mask (from a hardware store), a double-layered or microfilter vacuum cleaner bag, or a vacuum cleaner with a HEPA filter.  Avoid perfumes, talcum powder, hair spray, paints and other strong odors and fumes.  Keep warm-blooded pets (cats, dogs, rodents, birds) outside the home if they are triggers for asthma. If you can't keep the pet outdoors, keep the pet out of your bedroom and other sleeping areas at all times, and keep the door closed. Remove carpets and furniture covered with cloth from your home. If that is not possible, keep the pet away from fabric-covered furniture and carpets.  Eliminate cockroaches. Keep food and garbage in closed containers. Never leave food out. Use poison baits, traps, powders, gels, or paste (for example, boric acid). If a spray is used to kill cockroaches, stay out of the room until the odor goes away.  Decrease indoor humidity to less than 60%. Use an indoor air cleaning device.  Avoid sulfites in foods and beverages. Do not drink beer or wine or eat dried fruit, processed potatoes, or shrimp if they cause asthma symptoms.  Avoid cold air. Cover your nose and mouth with a scarf on cold or windy days.  Avoid aspirin. This is the most common drug causing serious asthma attacks.  If exercise triggers your asthma, ask your caregiver how you should prepare before exercising. (For example, ask if you could use your inhaler 10 minutes before exercising.)  Avoid close contact with people who have a cold or the flu since your asthma symptoms may get worse if you catch the infection from them. Wash your hands thoroughly after touching items that may have been handled by others with a respiratory infection.  Get a flu shot every year to protect against the flu virus, which often makes asthma worse for days to weeks. Also get a pneumonia shot once every five to 10 years. Call your caregiver if you want further  information about measures you can take to help prevent asthma attacks. Document Released: 12/22/2004 Document Revised: 03/16/2011 Document Reviewed: 10/30/2008 [facility] Patient Information 2014 [place], [place].

## 2012-08-18 ENCOUNTER — Ambulatory Visit (INDEPENDENT_AMBULATORY_CARE_PROVIDER_SITE_OTHER): Payer: 59 | Admitting: Nurse Practitioner

## 2012-08-18 ENCOUNTER — Encounter: Payer: Self-pay | Admitting: Nurse Practitioner

## 2012-08-18 VITALS — BP 130/87 | HR 72 | Ht 75.0 in | Wt 245.0 lb

## 2012-08-18 DIAGNOSIS — IMO0002 Reserved for concepts with insufficient information to code with codable children: Secondary | ICD-10-CM

## 2012-08-18 DIAGNOSIS — M5416 Radiculopathy, lumbar region: Secondary | ICD-10-CM | POA: Insufficient documentation

## 2012-08-18 DIAGNOSIS — M549 Dorsalgia, unspecified: Secondary | ICD-10-CM

## 2012-08-18 MED ORDER — GABAPENTIN 300 MG PO CAPS: ORAL_CAPSULE | ORAL | Status: DC

## 2012-08-18 NOTE — Progress Notes (Addendum)
Reason for visit  Follow up for  Lumbar radiculopathy HPI: Benjamin Conner 57 year old male returns for followup. He is a previous patient of Dr. Sandria Manly who has left the practice. He has a history of right thumb and index finger numbness associated with neck pain  in 2005 which was evaluated with a normal EMG/NCV 07/26/2008 of the left and right upper extremities.  He has a history of lower back pain since 9/11 extending to the right knee occurred mostly while standing. It can occur either when he first gets up in the morning it is worse later in the day it is made worse by bending over and standing. There is no change with lying down. He describes it as a stabbing, sharp pain.It starts in his lower back and  does not go below the knee. Evaluation with lumbar spine x-rays 11/01/09 which are reviewed show lumbar scoliosis to the left and narrowing of the L3-4, L4-5, and L5-S1 disc spaces, worse at L5-S1. he denies bowel or bladder incontinence. Workup has included  cholesterol 209, TG 293, HDL 38, LDL 112, EKG left axis sedation, urinalysis with small leukocytes, MRI of the cervical spine 02/10/2003 showed diffuse bulge of the  C5-6 disc and tiny central bulge at T2-3.He takes Aleve 220 mg once daily and Flexeril 5 mg twice daily. He works at [employer] and stands most of the day.  TODAY: 08/18/12. Back pain about the same. 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 Stands all day long. Sometimes his legs swell, does not use support stockings.  He has stopped his back exercises.  His Gabapentin is helpful but he would like a dose increase. He had a recent evaluation by Dr. Vickey Huger as a sleep consult   ROS:  Negative except for fatigue, blurred vision, shortness of breath, wheezing joint pain, allergies, numbness, weakness, decreased energy   Medications Current Outpatient Prescriptions on File Prior to Visit  Medication Sig Dispense  Refill  . albuterol (PROVENTIL HFA;VENTOLIN HFA) 108 (90 BASE) MCG/ACT inhaler Inhale 2 puffs into the lungs every 6 (six) hours as needed for wheezing.  1 Inhaler  1  . aspirin 81 MG tablet Take 81 mg by mouth daily.      Marland Kitchen atorvastatin (LIPITOR) 20 MG tablet Take 1 tablet (20 mg total) by mouth daily.  90 tablet  1  . cyclobenzaprine (FLEXERIL) 5 MG tablet Take 2 tabs bid  360 tablet  1  . esomeprazole (NEXIUM) 40 MG capsule Take 1 capsule (40 mg total) by mouth daily before breakfast.  90 capsule  3  . fexofenadine (ALLEGRA) 180 MG tablet Take 180 mg by mouth daily.      . fluticasone (FLONASE) 50 MCG/ACT nasal spray Place 2 sprays into the nose daily.  48 g  3  . gabapentin (NEURONTIN) 300 MG capsule TAKE 2 CAPSULES TWICE A DAY  360 capsule  0  . meloxicam (MOBIC) 15 MG tablet Take 1 tablet (15 mg total) by mouth daily.  90 tablet  3  . Omega-3 Fatty Acids (FISH OIL PO) Take 1 tablet by mouth daily.      . solifenacin (VESICARE) 5 MG tablet Take 2 tablets (10 mg total) by mouth daily.  90 tablet  3  . tamsulosin (FLOMAX) 0.4 MG CAPS Take 1 capsule (0.4 mg total) by mouth daily.  90 capsule  3  . valsartan-hydrochlorothiazide (DIOVAN-HCT) 160-25 MG per tablet Take  1 tablet by mouth daily.  90 tablet  3   No current facility-administered medications on file prior to visit.    Allergies  Allergies  Allergen Reactions  . Asa [Aspirin]     Reacts as a sleeping pill    Physical Exam General: well developed, obese male  seated, in no evident distress Neurologic Exam Mental Status: Awake and fully alert. Oriented to place and time. Follows all commands. Speech and language normal.   Cranial Nerves: Fundoscopic exam without papilledema or pallor. Pupils equal, briskly reactive to light. Extraocular movements full without nystagmus. Visual fields full to confrontation. Hearing intact and symmetric to finger snap. Facial sensation intact. Face, tongue, palate move normally and symmetrically.  Neck flexion and extension normal. Right shoulder held higher than the left Motor: Normal bulk and tone. Normal strength in all tested extremity muscles.No focal weakness Sensory.: intact to touch and pinprick and vibratory.  Coordination: Rapid alternating movements normal in all extremities. Finger-to-nose and heel-to-shin performed accurately bilaterally. Mild outstretched hand tremor  Gait and Station: Arises from chair without difficulty. Stance is normal. Gait demonstrates normal stride length and balance . Able to heel, toe and tandem walk without difficulty.  Reflexes: 2+ and symmetric. Toes downgoing.     ASSESSMENT: Upper lumbar radiculopathy which is fairly well-controlled with gabapentin.     PLAN: Increase gabapentin to 2 caps every 8 hours/ will call in a new order to mail order Followup with sleep for scheduling sleep study Follow up yearly and when necessary Pt assigned to Dr. Kingsley Spittle Benjamin Conner, GNP-BC APRN  I reviewed the above note and documentation and agree with the history, physical exam, assessment and plan as outlined above.

## 2012-08-18 NOTE — Patient Instructions (Addendum)
Increase gabapentin to 2 caps every 8 hours/ will call in a new order to mail order Followup with sleep for scheduling sleep study Follow up yearly and when necessary

## 2012-08-29 ENCOUNTER — Encounter: Payer: Self-pay | Admitting: Internal Medicine

## 2012-08-29 ENCOUNTER — Ambulatory Visit (INDEPENDENT_AMBULATORY_CARE_PROVIDER_SITE_OTHER): Payer: 59 | Admitting: Internal Medicine

## 2012-08-29 VITALS — BP 130/80 | HR 80 | Ht 75.0 in | Wt 241.4 lb

## 2012-08-29 DIAGNOSIS — R195 Other fecal abnormalities: Secondary | ICD-10-CM

## 2012-08-29 DIAGNOSIS — R1310 Dysphagia, unspecified: Secondary | ICD-10-CM

## 2012-08-29 DIAGNOSIS — K219 Gastro-esophageal reflux disease without esophagitis: Secondary | ICD-10-CM

## 2012-08-29 MED ORDER — NA SULFATE-K SULFATE-MG SULF 17.5-3.13-1.6 GM/177ML PO SOLN: ORAL | Status: DC

## 2012-08-29 NOTE — Progress Notes (Signed)
Subjective:  Benjamin Conner - referring provider   Patient ID: Benjamin Conner, male    DOB: Sep 25, 1955, 57 y.o.   MRN: [id number]  HPI The patient is a very nice middle-aged man here because of recent heme + stool. He was seen at [facility] and Benjamin Conner found a heme + (iFOBT) stool on DRE. The patient reports a colonoscopy 6.5 yrs ago with one polyp removed and recalls that he was told to repeat a colonoscopy in 10 years. He is not having bowel habit changes nor does he have visible bleeding.  He does have chronic heartburn generally controlled by his PPI. Occasional breakthrough heartburn and some intermittent mild dysphagia with mid-sternal sticking point or slow passage of solid foods. He has never had an EGD. On PPI for > 5 years.  GI ROS otherwise negative  Allergies  Allergen Reactions  . Asa [Aspirin]     Reacts as a sleeping pill   Outpatient Prescriptions Prior to Visit  Medication Sig Dispense Refill  . albuterol (PROVENTIL HFA;VENTOLIN HFA) 108 (90 BASE) MCG/ACT inhaler Inhale 2 puffs into the lungs every 6 (six) hours as needed for wheezing.  1 Inhaler  1  . aspirin 81 MG tablet Take 81 mg by mouth daily.      Marland Kitchen atorvastatin (LIPITOR) 20 MG tablet Take 1 tablet (20 mg total) by mouth daily.  90 tablet  1  . cyclobenzaprine (FLEXERIL) 5 MG tablet Take 2 tabs bid  360 tablet  1  . esomeprazole (NEXIUM) 40 MG capsule Take 1 capsule (40 mg total) by mouth daily before breakfast.  90 capsule  3  . fexofenadine (ALLEGRA) 180 MG tablet Take 180 mg by mouth daily.      . fluticasone (FLONASE) 50 MCG/ACT nasal spray Place 2 sprays into the nose daily.  48 g  3  . meloxicam (MOBIC) 15 MG tablet Take 1 tablet (15 mg total) by mouth daily.  90 tablet  3  . Omega-3 Fatty Acids (FISH OIL PO) Take 2 tablets by mouth daily. 1000 mg twice daily      . solifenacin (VESICARE) 5 MG tablet Take 2 tablets (10 mg total) by mouth daily.  90 tablet  3  . tamsulosin (FLOMAX) 0.4 MG CAPS Take 1 capsule  (0.4 mg total) by mouth daily.  90 capsule  3  . valsartan-hydrochlorothiazide (DIOVAN-HCT) 160-25 MG per tablet Take 1 tablet by mouth daily.  90 tablet  3  . gabapentin (NEURONTIN) 300 MG capsule 2 TID  540 capsule  3   No facility-administered medications prior to visit.   Past Medical History  Diagnosis Date  . HYPERTENSION   . GERD   . PEPTIC ULCER DISEASE   . HYPERTROPHY PROSTATE W/UR OBST & OTH LUTS   . OSTEOARTHRITIS   . BACK PAIN     chronic  . Hyperlipidemia   . Pneumonia   . Numbness of hand     since 2005  . Snoring disorder   . Nocturia    Past Surgical History  Procedure Laterality Date  . Appendectomy  1987   History   Social History  . Marital Status: Married    Spouse Name: Benjamin Conner    Number of Children: 2  . Years of Education: 12   Occupational History  . Cashier     [employer]   Social History Main Topics  . Smoking status: Former Smoker    Types: Cigarettes  . Smokeless tobacco: Never Used  Comment: quit 8\2009  . Alcohol Use: 1.8 oz/week    3 Shots of liquor per week     Comment: 2-3 daily  . Drug Use: No  . Sexual Activity: Yes   Other Topics Concern  . None   Social History Narrative   Patient is married to Benjamin Conner. Patient has a high school education. Patient is a Conservation officer, nature for [employer].   Caffeine- four daily.   Left handed.   Family History  Problem Relation Age of Onset  . Heart disease Father   . Coronary artery disease Mother     Stent at age 73  . Hypertension Mother   . High Cholesterol Mother   . Hypertension Brother   . Diabetes Brother    Review of Systems All other ROS negative except as per HPI    Objective:   Physical Exam General:  Well-developed, well-nourished and in no acute distress Eyes:  anicteric. ENT:   Mouth and posterior pharynx free of lesions.  Neck:   supple w/o thyromegaly or mass.  Lungs: Clear to auscultation bilaterally. Heart:  S1S2, no rubs, murmurs, gallops. Abdomen:   soft, non-tender, no hepatosplenomegaly, hernia, or mass and BS+.  Rectal: deferred Lymph:  no cervical or supraclavicular adenopathy. Extremities:   no edema Skin   no rash. Neuro:  A&O x 3.  Psych:  appropriate mood and  Affect.   Data Reviewed: Lab Results  Component Value Date   WBC 13.7* 02/08/2012   HGB 14.7 02/08/2012   HCT 43.9 02/08/2012   MCV 90.8 02/08/2012   PLT 261 05/27/2011   [facility] notes from 2014    Assessment & Plan:  Heme + stool (iFOBT)  GERD (gastroesophageal reflux disease)  Dysphagia, unspecified(787.20)  1. Colonoscopy to evaluate iFOBT + stool 2. EGD, possible dilation for GERD (Barrett's screen) dysphagia The risks and benefits as well as alternatives of endoscopic procedure(s) have been discussed and reviewed. All questions answered. The patient agrees to proceed. Request copies of previous colonoscopy and pathology report, if available  I appreciate the opportunity to care for this patient.  ZO:XWRU, Raymon Mutton, PA-C

## 2012-08-29 NOTE — Patient Instructions (Addendum)
You have been scheduled for an endoscopy and colonoscopy with propofol. Please follow the written instructions given to you at your visit today. Please pick up your prep at the pharmacy within the next 1-3 days. If you use inhalers (even only as needed), please bring them with you on the day of your procedure. Your physician has requested that you go to www.startemmi.com and enter the access code given to you at your visit today. This web site gives a general overview about your procedure. However, you should still follow specific instructions given to you by our office regarding your preparation for the procedure.  I appreciate the opportunity to care for you.  

## 2012-08-30 ENCOUNTER — Telehealth: Payer: Self-pay

## 2012-08-30 NOTE — Telephone Encounter (Signed)
Left message for patient to call me back to see if he has fax # at work I could fax a ROI to so we can obtain [facility]-Dr. Randa Evens records.

## 2012-08-31 ENCOUNTER — Encounter: Payer: Self-pay | Admitting: Internal Medicine

## 2012-09-02 ENCOUNTER — Ambulatory Visit (INDEPENDENT_AMBULATORY_CARE_PROVIDER_SITE_OTHER): Payer: 59 | Admitting: Neurology

## 2012-09-02 DIAGNOSIS — R0683 Snoring: Secondary | ICD-10-CM

## 2012-09-02 DIAGNOSIS — G4761 Periodic limb movement disorder: Secondary | ICD-10-CM

## 2012-09-02 DIAGNOSIS — G4733 Obstructive sleep apnea (adult) (pediatric): Secondary | ICD-10-CM

## 2012-09-02 DIAGNOSIS — J441 Chronic obstructive pulmonary disease with (acute) exacerbation: Secondary | ICD-10-CM

## 2012-09-02 NOTE — Telephone Encounter (Signed)
Mailing release to patient to sign so we may get his Dr. Randa Evens records since I was unable to get him on the phone.  Also mailed his Emmi code for him to view the colonoscopy film.

## 2012-09-16 ENCOUNTER — Encounter: Payer: Self-pay | Admitting: *Deleted

## 2012-09-16 ENCOUNTER — Telehealth: Payer: Self-pay | Admitting: *Deleted

## 2012-09-16 DIAGNOSIS — G4726 Circadian rhythm sleep disorder, shift work type: Secondary | ICD-10-CM

## 2012-09-16 DIAGNOSIS — J449 Chronic obstructive pulmonary disease, unspecified: Secondary | ICD-10-CM

## 2012-09-16 DIAGNOSIS — G4733 Obstructive sleep apnea (adult) (pediatric): Secondary | ICD-10-CM

## 2012-09-16 NOTE — Telephone Encounter (Signed)
Left message for patient regarding sleep study results, asked patient to call me back to discuss results and have questions answered.  Explained that a copy of the sleep study was sent to referring physician and copy of study is coming to them in the mail. ORDERS ARE BEING FORWARDED TO [facility] 09/16/2012.  A copy of the sleep study interpretive report as well as a letter with info regarding contact info for the DME company, the importance of CPAP compliance, and the NEED TO SCHEDULE follow up appointment will be mailed to the patient's home.

## 2012-09-23 ENCOUNTER — Encounter: Payer: Self-pay | Admitting: Internal Medicine

## 2012-09-23 ENCOUNTER — Ambulatory Visit (AMBULATORY_SURGERY_CENTER): Payer: 59 | Admitting: Internal Medicine

## 2012-09-23 VITALS — BP 143/85 | HR 70 | Temp 97.8°F | Resp 24 | Ht 75.0 in | Wt 241.0 lb

## 2012-09-23 DIAGNOSIS — D126 Benign neoplasm of colon, unspecified: Secondary | ICD-10-CM

## 2012-09-23 DIAGNOSIS — K5730 Diverticulosis of large intestine without perforation or abscess without bleeding: Secondary | ICD-10-CM

## 2012-09-23 DIAGNOSIS — K219 Gastro-esophageal reflux disease without esophagitis: Secondary | ICD-10-CM

## 2012-09-23 DIAGNOSIS — R195 Other fecal abnormalities: Secondary | ICD-10-CM

## 2012-09-23 MED ORDER — SODIUM CHLORIDE 0.9 % IV SOLN: 500.0000 mL | INTRAVENOUS | Status: DC

## 2012-09-23 NOTE — Progress Notes (Signed)
Called to room to assist during endoscopic procedure.  Patient ID and intended procedure confirmed with present staff. Received instructions for my participation in the procedure from the performing physician.  

## 2012-09-23 NOTE — Progress Notes (Signed)
Patient did not have preoperative order for IV antibiotic SSI prophylaxis. (G8918)  Patient did not experience any of the following events: a burn prior to discharge; a fall within the facility; wrong site/side/patient/procedure/implant event; or a hospital transfer or hospital admission upon discharge from the facility. (G8907)  

## 2012-09-23 NOTE — Op Note (Signed)
[facility] [address] [place], [address]   COLONOSCOPY PROCEDURE REPORT  PATIENT: Benjamin Conner, Benjamin Conner  MR#: [id number] BIRTHDATE: Mar 25, 1955 , 57  yrs. old GENDER: Male ENDOSCOPIST: Iva Boop, MD, [facility] REFERRED BY:   Eula Listen, PA-C PROCEDURE DATE:  09/23/2012 PROCEDURE:   Colonoscopy with biopsy and snare polypectomy First Screening Colonoscopy - Avg.  risk and is 50 yrs.  old or older - No.  Prior Negative Screening - Now for repeat screening. N/A  History of Adenoma - Now for follow-up colonoscopy & has been > or = to 3 yrs.  N/A  Polyps Removed Today? Yes. ASA CLASS:   Class II INDICATIONS:heme-positive stool. MEDICATIONS: Propofol (Diprivan) 210 mg IV, MAC sedation, administered by CRNA, These medications were titrated to patient response per physician's verbal order, and There was residual sedation effect present from prior procedure.  DESCRIPTION OF PROCEDURE:   After the risks benefits and alternatives of the procedure were thoroughly explained, informed consent was obtained.  A digital rectal exam revealed no abnormalities of the rectum, A digital rectal exam revealed the prostate was not enlarged, and A digital rectal exam revealed no prostatic nodules.   The LB [id number]  endoscope was introduced through the anus and advanced to the cecum, which was identified by both the appendix and ileocecal valve. No adverse events experienced.   The quality of the prep was Suprep good  The instrument was then slowly withdrawn as the colon was fully examined.  COLON FINDINGS: A sessile polyp measuring 2 mm in size was found at the ileocecal valve.  A polypectomy was performed with cold forceps.  The resection was complete and the polyp tissue was completely retrieved.   A sessile polyp measuring 6 mm in size was found in the descending colon.  A polypectomy was performed with a cold snare.  The resection was complete and the polyp tissue  was completely retrieved.   Moderate diverticulosis was noted.   Medium sized lipoma was found in the transverse colon.   The colon mucosa was otherwise normal.  Retroflexed views revealed no abnormalities. The time to cecum=2 minutes 16 seconds.  Withdrawal time=15 minutes 25 seconds.  The scope was withdrawn and the procedure completed. COMPLICATIONS: There were no complications.  ENDOSCOPIC IMPRESSION: 1.   Sessile polyp measuring 2 mm in size was found at the ileocecal valve; polypectomy was performed with cold forceps 2.   Sessile polyp measuring 6 mm in size was found in the descending colon; polypectomy was performed with a cold snare 3.   Moderate diverticulosis was noted 4.   Medium sized lipoma in the transverse colon 5.   The colon mucosa was otherwise normal - excellent prep  RECOMMENDATIONS: Timing of repeat colonoscopy will be determined by pathology findings.  eSigned:  Iva Boop, MD, [facility] 09/23/2012 3:36 PM  cc: The Patient  and Eula Listen, PA-C

## 2012-09-23 NOTE — Patient Instructions (Addendum)
The upper endoscopy was normal.  Two small polyps were found and removed at the colonoscopy.  You also have a condition called diverticulosis - common and not usually a problem. Please read the handout provided.  I will let you know pathology results and when to have another routine colonoscopy by mail.  I appreciate the opportunity to care for you. Iva Boop, MD, FACG   YOU HAD AN ENDOSCOPIC PROCEDURE TODAY AT THE [facility]: Refer to the procedure report that was given to you for any specific questions about what was found during the examination.  If the procedure report does not answer your questions, please call your gastroenterologist to clarify.  If you requested that your care partner not be given the details of your procedure findings, then the procedure report has been included in a sealed envelope for you to review at your convenience later.  YOU SHOULD EXPECT: Some feelings of bloating in the abdomen. Passage of more gas than usual.  Walking can help get rid of the air that was put into your GI tract during the procedure and reduce the bloating. If you had a lower endoscopy (such as a colonoscopy or flexible sigmoidoscopy) you may notice spotting of blood in your stool or on the toilet paper. If you underwent a bowel prep for your procedure, then you may not have a normal bowel movement for a few days.  DIET: Your first meal following the procedure should be a light meal and then it is ok to progress to your normal diet.  A half-sandwich or bowl of soup is an example of a good first meal.  Heavy or fried foods are harder to digest and may make you feel nauseous or bloated.  Likewise meals heavy in dairy and vegetables can cause extra gas to form and this can also increase the bloating.  Drink plenty of fluids but you should avoid alcoholic beverages for 24 hours.  ACTIVITY: Your care partner should take you home directly after the procedure.  You should plan to take it  easy, moving slowly for the rest of the day.  You can resume normal activity the day after the procedure however you should NOT DRIVE or use heavy machinery for 24 hours (because of the sedation medicines used during the test).    SYMPTOMS TO REPORT IMMEDIATELY: A gastroenterologist can be reached at any hour.  During normal business hours, 8:30 AM to 5:00 PM Monday through Friday, call [phone].  After hours and on weekends, please call the GI answering service at [phone] who will take a message and have the physician on call contact you.   Following lower endoscopy (colonoscopy or flexible sigmoidoscopy):  Excessive amounts of blood in the stool  Significant tenderness or worsening of abdominal pains  Swelling of the abdomen that is new, acute  Fever of 100F or higher  Following upper endoscopy (EGD)  Vomiting of blood or coffee ground material  New chest pain or pain under the shoulder blades  Painful or persistently difficult swallowing  New shortness of breath  Fever of 100F or higher  Black, tarry-looking stools  FOLLOW UP: If any biopsies were taken you will be contacted by phone or by letter within the next 1-3 weeks.  Call your gastroenterologist if you have not heard about the biopsies in 3 weeks.  Our staff will call the home number listed on your records the next business day following your procedure to check on you and address  any questions or concerns that you may have at that time regarding the information given to you following your procedure. This is a courtesy call and so if there is no answer at the home number and we have not heard from you through the emergency physician on call, we will assume that you have returned to your regular daily activities without incident.  SIGNATURES/CONFIDENTIALITY: You and/or your care partner have signed paperwork which will be entered into your electronic medical record.  These signatures attest to the fact that that the  information above on your After Visit Summary has been reviewed and is understood.  Full responsibility of the confidentiality of this discharge information lies with you and/or your care-partner.    INFORMATION ON POLYPS AND DIVERTICULOSIS GIVEN TO YOU & DISCUSSED TODAY

## 2012-09-23 NOTE — Progress Notes (Signed)
Procedure ends, to recovery, report given and VSS. 

## 2012-09-23 NOTE — Op Note (Signed)
[facility] [address] [place], [address]   ENDOSCOPY PROCEDURE REPORT  PATIENT: Marq, Rebello  MR#: [id number] BIRTHDATE: April 07, 1955 , 57  yrs. old GENDER: Male ENDOSCOPIST: Iva Boop, MD, [facility] REFERRED BY:   Eula Listen, PA-C PROCEDURE DATE:  09/23/2012 PROCEDURE:  EGD, diagnostic ASA CLASS:     Class II INDICATIONS:  Barrett's screening.   History of esophageal reflux. MEDICATIONS: Propofol (Diprivan) 140 mg IV, MAC sedation, administered by CRNA, and These medications were titrated to patient response per physician's verbal order TOPICAL ANESTHETIC: Cetacaine Spray  DESCRIPTION OF PROCEDURE: After the risks benefits and alternatives of the procedure were thoroughly explained, informed consent was obtained.  The LB [id number] endoscope was introduced through the mouth and advanced to the second portion of the duodenum. Without limitations.  The instrument was slowly withdrawn as the mucosa was fully examined.      The upper, middle and distal third of the esophagus were carefully inspected and no abnormalities were noted.  The z-line was well seen at the GEJ.  The endoscope was pushed into the fundus which was normal including a retroflexed view.  The antrum, gastric body, first and second part of the duodenum were unremarkable. Retroflexed views revealed no abnormalities.     The scope was then withdrawn from the patient and the procedure completed.  COMPLICATIONS: There were no complications. ENDOSCOPIC IMPRESSION: Normal EGD  RECOMMENDATIONS: 1.  Proceed with a Colonoscopy. 2.  Continue PPI   eSigned:  Iva Boop, MD, [facility] 09/23/2012 3:31 PM   CC:The Patient and Eula Listen, PA-C

## 2012-09-26 ENCOUNTER — Telehealth: Payer: Self-pay | Admitting: *Deleted

## 2012-09-26 NOTE — Telephone Encounter (Signed)
  Follow up Call-  Call back number 09/23/2012  Post procedure Call Back phone  # can't call due to work  Permission to leave phone message No     Patient questions:  Patient is at work, and we can't call him there.

## 2012-09-27 ENCOUNTER — Telehealth: Payer: Self-pay | Admitting: Internal Medicine

## 2012-09-27 NOTE — Telephone Encounter (Signed)
Rec's from [facility] forward 11 pages to Dr.Gessner

## 2012-09-30 ENCOUNTER — Encounter: Payer: Self-pay | Admitting: Internal Medicine

## 2012-09-30 DIAGNOSIS — Z860100 Personal history of colon polyps, unspecified: Secondary | ICD-10-CM

## 2012-09-30 HISTORY — DX: Personal history of colon polyps, unspecified: Z86.0100

## 2012-09-30 HISTORY — DX: Personal history of colonic polyps: Z86.010

## 2012-09-30 NOTE — Progress Notes (Signed)
Quick Note:  2 adenomas max 6 mm Repeat colonoscopy 2019 ______

## 2012-10-02 ENCOUNTER — Encounter: Payer: Self-pay | Admitting: Internal Medicine

## 2012-10-03 ENCOUNTER — Telehealth: Payer: Self-pay | Admitting: Physician Assistant

## 2012-10-03 ENCOUNTER — Telehealth: Payer: Self-pay

## 2012-10-03 NOTE — Telephone Encounter (Signed)
Patient is returning phone call. Patient did not specify what his return phone call is about. He thinks it may be about lab results.  [phone]

## 2012-10-03 NOTE — Telephone Encounter (Signed)
Please call patient and verify that he has received call from GI that EGD was normal and next colonoscopy is due in September of 2019. Had a few polyps removed.

## 2012-10-03 NOTE — Telephone Encounter (Signed)
Called to advise him. Left message for him to call me back.

## 2012-10-04 NOTE — Telephone Encounter (Signed)
It was in regards to his colonoscopy. Called again.  EGD was normal and next colonoscopy is due in September of 2019. Had a few polyps removed. Left message for him to call me back.

## 2012-10-07 NOTE — Telephone Encounter (Signed)
Called again, unable to reach.  

## 2012-10-14 ENCOUNTER — Telehealth: Payer: Self-pay | Admitting: Physician Assistant

## 2012-10-14 NOTE — Telephone Encounter (Signed)
Called patient left detailed  message  °

## 2012-10-14 NOTE — Telephone Encounter (Signed)
Please call the patient. Please have patient schedule an appointment with me after he sees Dr. Vickey Huger in follow up regarding his sleep study.   Reviewed patient's sleep study. Thyroid function normal 07/25/12.

## 2012-12-15 ENCOUNTER — Other Ambulatory Visit: Payer: Self-pay | Admitting: Internal Medicine

## 2013-01-20 ENCOUNTER — Other Ambulatory Visit: Payer: Self-pay | Admitting: Physician Assistant

## 2013-03-17 ENCOUNTER — Ambulatory Visit: Payer: 59 | Admitting: Physician Assistant

## 2013-03-17 VITALS — BP 155/90 | HR 86 | Temp 98.2°F | Resp 18 | Wt 248.0 lb

## 2013-03-17 DIAGNOSIS — E785 Hyperlipidemia, unspecified: Secondary | ICD-10-CM

## 2013-03-17 DIAGNOSIS — I10 Essential (primary) hypertension: Secondary | ICD-10-CM

## 2013-03-17 DIAGNOSIS — N400 Enlarged prostate without lower urinary tract symptoms: Secondary | ICD-10-CM

## 2013-03-17 DIAGNOSIS — K219 Gastro-esophageal reflux disease without esophagitis: Secondary | ICD-10-CM

## 2013-03-17 DIAGNOSIS — M1990 Unspecified osteoarthritis, unspecified site: Secondary | ICD-10-CM

## 2013-03-17 LAB — POCT CBC
GRANULOCYTE PERCENT: 68.4 % (ref 37–80)
HCT, POC: 48 % (ref 43.5–53.7)
Hemoglobin: 15.8 g/dL (ref 14.1–18.1)
Lymph, poc: 2.4 (ref 0.6–3.4)
MCH: 30.7 pg (ref 27–31.2)
MCHC: 32.9 g/dL (ref 31.8–35.4)
MCV: 93.4 fL (ref 80–97)
MID (CBC): 0.5 (ref 0–0.9)
MPV: 9.3 fL (ref 0–99.8)
PLATELET COUNT, POC: 266 10*3/uL (ref 142–424)
POC Granulocyte: 6.4 (ref 2–6.9)
POC LYMPH PERCENT: 25.9 %L (ref 10–50)
POC MID %: 5.7 %M (ref 0–12)
RBC: 5.14 M/uL (ref 4.69–6.13)
RDW, POC: 12.8 %
WBC: 9.3 10*3/uL (ref 4.6–10.2)

## 2013-03-17 MED ORDER — SOLIFENACIN SUCCINATE 5 MG PO TABS: 10.0000 mg | ORAL_TABLET | Freq: Every day | ORAL | Status: DC

## 2013-03-17 MED ORDER — MELOXICAM 15 MG PO TABS: 15.0000 mg | ORAL_TABLET | Freq: Every day | ORAL | Status: DC

## 2013-03-17 MED ORDER — VALSARTAN-HYDROCHLOROTHIAZIDE 160-25 MG PO TABS: 1.0000 | ORAL_TABLET | Freq: Every day | ORAL | Status: DC

## 2013-03-17 MED ORDER — GABAPENTIN 300 MG PO CAPS: ORAL_CAPSULE | ORAL | Status: DC

## 2013-03-17 MED ORDER — TAMSULOSIN HCL 0.4 MG PO CAPS: 0.4000 mg | ORAL_CAPSULE | Freq: Every day | ORAL | Status: DC

## 2013-03-17 MED ORDER — ALBUTEROL SULFATE HFA 108 (90 BASE) MCG/ACT IN AERS: 2.0000 | INHALATION_SPRAY | Freq: Four times a day (QID) | RESPIRATORY_TRACT | Status: DC | PRN

## 2013-03-17 MED ORDER — ESOMEPRAZOLE MAGNESIUM 40 MG PO CPDR: 40.0000 mg | DELAYED_RELEASE_CAPSULE | Freq: Every day | ORAL | Status: DC

## 2013-03-17 MED ORDER — ATORVASTATIN CALCIUM 20 MG PO TABS: 20.0000 mg | ORAL_TABLET | Freq: Every day | ORAL | Status: DC

## 2013-03-17 MED ORDER — CYCLOBENZAPRINE HCL 5 MG PO TABS: ORAL_TABLET | ORAL | Status: DC

## 2013-03-17 NOTE — Progress Notes (Signed)
Subjective:    Patient ID: Benjamin Conner, male    DOB: 12-Sep-1955, 58 y.o.   MRN: [id number]  HPI Primary Physician: Marcille Blanco  Chief Complaint: Rx refills   HPI: 58 y.o. male with history below presents for medication refill.  1) Hypertension: Blood pressure typically much better controlled than it is today. Currently on valsartan/HCTZ 160/25 mg daily. Tolerating without issues. Working on eating a healthier diet. Does not eat fried foods. Has cut of sweet tea. Would like to cut out regular sodas. Starting to have a walking routine. No CP, HA, vision changes, or focal deficits.    2) Hyperlipidemia: Currently on Lipitor 20 mg daily without issues. Working on eating a healthier diet. No fried foods. Starting a walking routine. Knows he needs to lose some weight. Last lipid panel on 07/25/12 had a TC of 223, LDL of 128, HDL of 45, and trig of 250.   3) GERD: Had EGD in September 2014. Patient reports everything came back fine. Well controlled on Nexium 40 mg daily. Needs refills.    4) BPH: Currently on FLomax 0.4 mg qhs and Vesicare 5 mg daily without issues. No changes. Needs refills.   5) Osteoarthritis/back pain: Followed by Dr. Hassell Done. Currently on gabapentin 300 mg 3 po bid and Mobic 15 mg daily. No issues. Needs refills.    6) Sleep apnea: Saw Dr. Brett Fairy and confirmed sleep apnea. Unable to tolerate mask. Not wearing. Needs to reschedule appointment to discuss. Works rotating shifts for work. Not much daytime fatigue.   7) Seasonal allergies: Typically with flares in the spring. Has Flonase on hand for this. This helps him quite a bit. May need refills. Will call when he does.   8) Had his colonoscopy in September 2014. Had 2 adenomas removed. Max was 6 mm. Next colonoscopy is due in 2019.    Past Medical History  Diagnosis Date  . HYPERTENSION   . GERD   . PEPTIC ULCER DISEASE   . HYPERTROPHY PROSTATE W/UR OBST & OTH LUTS   . OSTEOARTHRITIS   . BACK PAIN    chronic  . Hyperlipidemia   . Pneumonia   . Numbness of hand     since 2005  . Snoring disorder   . Nocturia   . Personal history of colonic adenomas 09/30/2012     Home Meds: Prior to Admission medications   Medication Sig Start Date End Date Taking? Authorizing Provider  albuterol (VENTOLIN HFA) 108 (90 BASE) MCG/ACT inhaler Inhale 2 puffs into the lungs every 6 (six) hours as needed. PATIENT NEEDS OFFICE VISIT FOR ADDITIONAL REFILLS 12/15/12  Yes Mancel Bale, PA-C  aspirin 81 MG tablet Take 81 mg by mouth daily.   Yes Historical Provider, MD  atorvastatin (LIPITOR) 20 MG tablet Take 1 tablet (20 mg total) by mouth daily. 08/02/12  Yes Kaulder Zahner M Lenea Bywater, PA-C  cyclobenzaprine (FLEXERIL) 5 MG tablet Take 2 tabs bid 07/25/12  Yes Meleane Selinger M Ermias Tomeo, PA-C  esomeprazole (NEXIUM) 40 MG capsule Take 1 capsule (40 mg total) by mouth daily before breakfast. 07/25/12  Yes Treven Holtman M Darwyn Ponzo, PA-C  fexofenadine (ALLEGRA) 180 MG tablet Take 180 mg by mouth daily.   Yes Historical Provider, MD  fluticasone (FLONASE) 50 MCG/ACT nasal spray Place 2 sprays into the nose daily. 07/25/12 07/25/13 Yes Nashly Olsson M Izaha Shughart, PA-C  gabapentin (NEURONTIN) 300 MG capsule Take 300 mg by mouth 3 (three) times daily. 3 TID 08/18/12  Yes Dennie Bible, NP  gabapentin (  NEURONTIN) 300 MG capsule TAKE 2 CAPSULES TWICE A DAY 01/20/13  Yes Fiorella Hanahan M Zymiere Trostle, PA-C  meloxicam (MOBIC) 15 MG tablet Take 1 tablet (15 mg total) by mouth daily. 07/25/12  Yes Jonh Mcqueary M Pammy Vesey, PA-C  Omega-3 Fatty Acids (FISH OIL PO) Take 2 tablets by mouth daily. 1000 mg twice daily   Yes Historical Provider, MD  solifenacin (VESICARE) 5 MG tablet Take 2 tablets (10 mg total) by mouth daily. 07/25/12  Yes Shamone Winzer M Abed Schar, PA-C  tamsulosin (FLOMAX) 0.4 MG CAPS Take 1 capsule (0.4 mg total) by mouth daily. 07/25/12  Yes Kingdom Vanzanten M Sarah Baez, PA-C  valsartan-hydrochlorothiazide (DIOVAN-HCT) 160-25 MG per tablet Take 1 tablet by mouth daily. 07/25/12  Yes Ademide Schaberg Lyn Hollingshead, PA-C    Allergies:  Allergies    Allergen Reactions  . Asa [Aspirin]     Reacts as a sleeping pill    History   Social History  . Marital Status: Married    Spouse Name: [name]    Number of Children: 2  . Years of Education: 12   Occupational History  . Cashier     [employer]   Social History Main Topics  . Smoking status: Former Smoker    Types: Cigarettes  . Smokeless tobacco: Never Used     Comment: quit 8\2009  . Alcohol Use: 1.8 oz/week    3 Shots of liquor per week     Comment: 2-3 daily  . Drug Use: No  . Sexual Activity: Yes   Other Topics Concern  . Not on file   Social History Narrative   Patient is married to [place]. Patient has a high school education. Patient is a Scientist, water quality for [employer].   Caffeine- four daily.   Left handed.     Review of Systems  Constitutional: Negative for fever, chills and fatigue.  Eyes: Negative for visual disturbance.  Respiratory: Negative for cough, shortness of breath and wheezing.   Cardiovascular: Negative for chest pain.  Neurological: Negative for headaches.       Objective:   Physical Exam  Physical Exam: Blood pressure 155/90, pulse 86, temperature 98.2 F (36.8 C), temperature source Oral, resp. rate 18, weight 248 lb (112.492 kg), SpO2 96.00%., Body mass index is 31 kg/(m^2). General: Well developed, well nourished, in no acute distress. Head: Normocephalic, atraumatic, eyes without discharge, sclera non-icteric, nares are without discharge. Bilateral auditory canals clear, TM's are without perforation, pearly grey and translucent with reflective cone of light bilaterally. Oral cavity moist, posterior pharynx without exudate, erythema, peritonsillar abscess, or post nasal drip. Uvula midline.   Neck: Supple. No thyromegaly. Full ROM. No lymphadenopathy. Lungs: Clear bilaterally to auscultation without wheezes, rales, or rhonchi. Breathing is unlabored. Heart: RRR with S1 S2. No murmurs, rubs, or gallops appreciated. Msk:  Strength  and tone normal for age. Extremities/Skin: Warm and dry. No clubbing or cyanosis. No edema. No rashes or suspicious lesions. Neuro: Alert and oriented X 3. Moves all extremities spontaneously. Gait is normal. CNII-XII grossly in tact. Psych:  Responds to questions appropriately with a normal affect.    Labs: Results for orders placed in visit on 03/17/13  POCT CBC      Result Value Ref Range   WBC 9.3  4.6 - 10.2 K/uL   Lymph, poc 2.4  0.6 - 3.4   POC LYMPH PERCENT 25.9  10 - 50 %L   MID (cbc) 0.5  0 - 0.9   POC MID % 5.7  0 - 12 %M  POC Granulocyte 6.4  2 - 6.9   Granulocyte percent 68.4  37 - 80 %G   RBC 5.14  4.69 - 6.13 M/uL   Hemoglobin 15.8  14.1 - 18.1 g/dL   HCT, POC 48.0  43.5 - 53.7 %   MCV 93.4  80 - 97 fL   MCH, POC 30.7  27 - 31.2 pg   MCHC 32.9  31.8 - 35.4 g/dL   RDW, POC 12.8     Platelet Count, POC 266  142 - 424 K/uL   MPV 9.3  0 - 99.8 fL    CMP and TSH pending     Assessment & Plan:  58 year old male with hypertension, hyperlipidemia, GERD, BPH, osteoarthritis, back pain, sleep apnea, and seasonal allergies.   1) Hypertension -Diovan HCT 160/25 mg daily #90 RF 3 -Healthy diet and exercise -Weight loss -Kudos for quitting smoking  2) Hyperlipidemia -Lipitor 20 mg daily #90 RF 3 -Healthy diet and exercise -Weight loss  3) GERD -Nexium 40 mg daily #90 RF 3  4) BPH -Flomax 0.4 mg qhs #90 RF 3 -Vesicare 5 mg daily #90 RF 3  5) Osteoarthritis/back pain -Gabapentin 300 mg 3 po bid #540 RF 3 -Mobic 15 mg daily #90 RF 3  6) Sleep apnea -Follow up with Dr. Brett Fairy -Discussed alternative treatment options -Weight loss  7) Seasonal allergies -Has Flonase at home, call when needs a RF  8) Discussed with patient low dose lung CT for lung carcinoma screening. Discussed risks and benefits. Discussed follow up plan of this should he decide to move forward with screening. He will think about the screening and discuss this with this wife. Patient has  a smoking history of 36 years and has quit smoking for 5 years.     Christell Faith, MHS, PA-C [facility] [place], [place] [address] [facility] [place] 03/17/2013 6:14 PM

## 2013-03-18 LAB — COMPREHENSIVE METABOLIC PANEL
ALBUMIN: 4.4 g/dL (ref 3.5–5.2)
ALK PHOS: 84 U/L (ref 39–117)
ALT: 68 U/L — AB (ref 0–53)
AST: 36 U/L (ref 0–37)
BUN: 18 mg/dL (ref 6–23)
CO2: 28 mEq/L (ref 19–32)
Calcium: 10.6 mg/dL — ABNORMAL HIGH (ref 8.4–10.5)
Chloride: 103 mEq/L (ref 96–112)
Creat: 0.9 mg/dL (ref 0.50–1.35)
Glucose, Bld: 123 mg/dL — ABNORMAL HIGH (ref 70–99)
POTASSIUM: 4.2 meq/L (ref 3.5–5.3)
Sodium: 140 mEq/L (ref 135–145)
Total Bilirubin: 0.6 mg/dL (ref 0.2–1.2)
Total Protein: 7.1 g/dL (ref 6.0–8.3)

## 2013-03-18 LAB — TSH: TSH: 2.166 u[IU]/mL (ref 0.350–4.500)

## 2013-07-31 ENCOUNTER — Telehealth: Payer: Self-pay | Admitting: *Deleted

## 2013-07-31 NOTE — Telephone Encounter (Signed)
Calling patient to r/s appointment, no answer left message to return the call to schedule with Dr. Brett Fairy or NP CM.

## 2013-08-04 NOTE — Telephone Encounter (Signed)
Called patient again to r/s appointment, left voice message to return the call to r/s appointment with NP CM.

## 2013-08-08 ENCOUNTER — Encounter: Payer: Self-pay | Admitting: *Deleted

## 2013-08-08 NOTE — Telephone Encounter (Signed)
Calling patient again to r/s his appointment for 08/18/13, still no answer, letter will be mailed out to patient.

## 2013-08-11 ENCOUNTER — Telehealth: Payer: Self-pay | Admitting: *Deleted

## 2013-08-11 NOTE — Telephone Encounter (Signed)
Patient returning my call appointment was scheduled for 11/17/13 at 10:30 am.

## 2013-08-18 ENCOUNTER — Ambulatory Visit: Payer: 59 | Admitting: Nurse Practitioner

## 2013-10-10 ENCOUNTER — Telehealth: Payer: Self-pay | Admitting: *Deleted

## 2013-10-10 ENCOUNTER — Encounter: Payer: Self-pay | Admitting: *Deleted

## 2013-10-10 NOTE — Telephone Encounter (Signed)
Calling patient to change appointment time on 11/17/13, left message that appointment time was changed to 9:30 am with NP LL. Letter will be mailed out to the patient.

## 2013-11-17 ENCOUNTER — Ambulatory Visit (INDEPENDENT_AMBULATORY_CARE_PROVIDER_SITE_OTHER): Payer: 59 | Admitting: Nurse Practitioner

## 2013-11-17 ENCOUNTER — Encounter: Payer: Self-pay | Admitting: Nurse Practitioner

## 2013-11-17 VITALS — BP 141/85 | HR 75 | Ht 75.0 in | Wt 251.0 lb

## 2013-11-17 DIAGNOSIS — M4712 Other spondylosis with myelopathy, cervical region: Secondary | ICD-10-CM

## 2013-11-17 DIAGNOSIS — G4700 Insomnia, unspecified: Secondary | ICD-10-CM

## 2013-11-17 DIAGNOSIS — J0191 Acute recurrent sinusitis, unspecified: Secondary | ICD-10-CM

## 2013-11-17 DIAGNOSIS — M25561 Pain in right knee: Secondary | ICD-10-CM | POA: Insufficient documentation

## 2013-11-17 DIAGNOSIS — M5416 Radiculopathy, lumbar region: Secondary | ICD-10-CM

## 2013-11-17 MED ORDER — FLUTICASONE PROPIONATE 50 MCG/ACT NA SUSP: 2.0000 | Freq: Every day | NASAL | Status: DC

## 2013-11-17 NOTE — Progress Notes (Signed)
PATIENT: Benjamin Conner DOB: 08/25/1955  REASON FOR VISIT: routine follow up for neck pain, back pain, right leg pain HISTORY FROM: patient  HISTORY OF PRESENT ILLNESS: Benjamin Conner 58 year old male returns for followup. He is a previous patient of Dr. Erling Cruz who has left the practice. He has a history of right thumb and index finger numbness associated with neck pain  in 2005 which was evaluated with a normal EMG/NCV 07/26/2008 of the left and right upper extremities.  He has a history of lower back pain since 9/11 extending to the right knee occurred mostly while standing. It can occur either when he first gets up in the morning it is worse later in the day it is made worse by bending over and standing. There is no change with lying down. He describes it as a stabbing, sharp pain.It starts in his lower back and  does not go below the knee. Evaluation with lumbar spine x-rays 11/01/09 which are reviewed show lumbar scoliosis to the left and narrowing of the L3-4, L4-5, and L5-S1 disc spaces, worse at L5-S1. he denies bowel or bladder incontinence. Workup has included  cholesterol 209, TG 293, HDL 38, LDL 112, EKG left axis sedation, urinalysis with small leukocytes, MRI of the cervical spine 02/10/2003 showed diffuse bulge of the  C5-6 disc and tiny central bulge at T2-3.He takes Aleve 220 mg once daily and Flexeril 5 mg twice daily. He works at [employer] and stands most of the day.  08/18/12. Back pain about the same. 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 Stands all day long. Sometimes his legs swell, does not use support stockings.  He has stopped his back exercises.  His Gabapentin is helpful but he would like a dose increase. He had an evaluation by Dr. Brett Fairy as a sleep consult.   11/17/13 (LL): He returns for annual followup. He states neck pain and back pain are the same, but now has pain in the right medial  knee. He is taking Meloxicam and Aleve for pain. He has not been evaluated by anyone for this problem. His gabapentin was increased by PCP, now taking 900 mg every 12 hours. Job is the same, still stands for 8 hours straight. He was found to have sleep apnea by sleep study with Dr. Brett Fairy but could not stand to wear the mask; he had a nasal pillow.  He brought the machine back. He is having more trouble sleeping, is taking AlevePM most nights.    REVIEW OF SYSTEMS: Full 14 system review of systems performed and notable only for: Pain in neck, right shoulder, right leg and right knee. Appetite change, Unexpected weight change, Env Allergies.   ALLERGIES: Allergies  Allergen Reactions  . Asa [Aspirin]     Reacts as a sleeping pill    HOME MEDICATIONS: Outpatient Prescriptions Prior to Visit  Medication Sig Dispense Refill  . albuterol (VENTOLIN HFA) 108 (90 BASE) MCG/ACT inhaler Inhale 2 puffs into the lungs every 6 (six) hours as needed. 3 each 1  . aspirin 81 MG tablet Take 81 mg by mouth daily.    Marland Kitchen atorvastatin (LIPITOR) 20 MG tablet Take 1 tablet (20 mg total) by mouth daily. 90 tablet 3  . cyclobenzaprine (FLEXERIL) 5 MG tablet Take 2 tabs bid 360 tablet 1  . esomeprazole (NEXIUM) 40 MG capsule Take 1 capsule (40 mg total) by mouth daily before  breakfast. 90 capsule 3  . fexofenadine (ALLEGRA) 180 MG tablet Take 180 mg by mouth daily as needed.     . gabapentin (NEURONTIN) 300 MG capsule Take 3 tabs po bid 540 capsule 3  . meloxicam (MOBIC) 15 MG tablet Take 1 tablet (15 mg total) by mouth daily. 90 tablet 3  . Omega-3 Fatty Acids (FISH OIL PO) Take 2 tablets by mouth daily. 1000 mg twice daily    . solifenacin (VESICARE) 5 MG tablet Take 2 tablets (10 mg total) by mouth daily. 90 tablet 3  . tamsulosin (FLOMAX) 0.4 MG CAPS capsule Take 1 capsule (0.4 mg total) by mouth daily. 90 capsule 3  . valsartan-hydrochlorothiazide (DIOVAN-HCT) 160-25 MG per tablet Take 1 tablet by mouth  daily. 90 tablet 3  . fluticasone (FLONASE) 50 MCG/ACT nasal spray Place 2 sprays into the nose daily. 48 g 3   No facility-administered medications prior to visit.    PHYSICAL EXAM Filed Vitals:   11/17/13 0919  BP: 141/85  Pulse: 75  Height: 6\' 3"  (1.905 m)  Weight: 251 lb (113.853 kg)   Body mass index is 31.37 kg/(m^2).  Physical Exam General: well developed, obese male  seated, in no evident distress Musculoskeletal: No deformity. No obvious swelling of right knee. Positive straight leg raise on the LEFT.  Neurologic Exam Mental Status: Awake and fully alert. Oriented to place and time. Follows all commands. Speech and language normal.    Cranial Nerves: Fundoscopic exam without papilledema or pallor. Pupils equal, briskly reactive to light. Extraocular movements full without nystagmus. Visual fields full to confrontation. Hearing intact and symmetric to finger snap. Facial sensation intact. Face, tongue, palate move normally and symmetrically. Neck flexion and extension normal. Right shoulder held higher than the left Motor: Normal bulk and tone. Normal strength in all tested extremity muscles.No focal weakness Sensory: intact to touch and pinprick and vibratory.   Coordination: Rapid alternating movements normal in all extremities. Finger-to-nose and heel-to-shin performed accurately bilaterally. Mild outstretched hand tremor Gait and Station: Arises from chair without difficulty. Stance is normal. Gait demonstrates normal stride length and balance . Able to heel, toe and tandem walk without difficulty.   Reflexes: 2+ and symmetric. Toes downgoing.   ASSESSMENT: 58 y.o. year old male  has a past medical history of HYPERTENSION; GERD; PEPTIC ULCER DISEASE; HYPERTROPHY PROSTATE W/UR OBST & OTH LUTS; OSTEOARTHRITIS; BACK PAIN; Hyperlipidemia; Pneumonia; Numbness of hand; Snoring disorder; Nocturia; and Personal history of colonic adenomas (09/30/2012). here with:  1. Upper lumbar  radiculopathy which is fairly well-controlled with gabapentin. 2. Right Knee Pain - likely degenerative disease, advised Orthopedic consultation.  3. Cervical DD with radiculopathy, advised conservative treatment. 4. Insomnia - Using AlevePM, try Melatonin  PLAN:  Continue gabapentin 3 caps every 12 hours as needed. Advised Orthopedic consultation for right knee pain. Try Melatonin 5 mg at bedtime for insomnia. Follow up yearly with Hoyle Sauer and when necessary. Pt assigned to Dr. Krista Blue  Return in about 1 year (around 11/18/2014), or if symptoms worsen or fail to improve, for back pain, radiculopathy.  Rudi Rummage Adrick Kestler, MSN, FNP-BC, A/GNP-C 11/17/2013, 10:01 AM [facility] [address] [place], [place] [address] [phone]  Note: This document was prepared with digital dictation and possible smart phrase technology. Any transcriptional errors that result from this process are unintentional.

## 2013-11-17 NOTE — Progress Notes (Signed)
I agree above plan. 

## 2013-11-17 NOTE — Patient Instructions (Addendum)
Continue gabapentin 3 caps every 12 hours. Continue Meloxicam as needed as well Try Melatonin 5 mg before bedtime for insomnia, ok to take with Aleve.   Aleve PM - the "PM" part of this medication is Diphenhydramine, which is "Benadryl".  Please see an orthopedics/Sports medicine specialist concerning your right knee.  Follow up yearly and when necessary, with Dr. Krista Blue or Hoyle Sauer.

## 2013-12-06 ENCOUNTER — Telehealth: Payer: Self-pay | Admitting: Cardiology

## 2013-12-06 NOTE — Telephone Encounter (Signed)
New message    Left message on home voice mail regarding flu shot.

## 2014-03-15 ENCOUNTER — Ambulatory Visit (INDEPENDENT_AMBULATORY_CARE_PROVIDER_SITE_OTHER): Payer: 59 | Admitting: Physician Assistant

## 2014-03-15 VITALS — BP 142/82 | HR 83 | Temp 97.7°F | Resp 16 | Ht 75.0 in | Wt 249.0 lb

## 2014-03-15 DIAGNOSIS — M47816 Spondylosis without myelopathy or radiculopathy, lumbar region: Secondary | ICD-10-CM | POA: Diagnosis not present

## 2014-03-15 DIAGNOSIS — S39012A Strain of muscle, fascia and tendon of lower back, initial encounter: Secondary | ICD-10-CM

## 2014-03-15 MED ORDER — MELOXICAM 15 MG PO TABS: 15.0000 mg | ORAL_TABLET | Freq: Every day | ORAL | Status: DC

## 2014-03-15 MED ORDER — CYCLOBENZAPRINE HCL 5 MG PO TABS: ORAL_TABLET | ORAL | Status: DC

## 2014-03-15 NOTE — Progress Notes (Signed)
Subjective:    Patient ID: Benjamin Conner, male    DOB: [birth year], 59 y.o.   MRN: [id number]  HPI  This is a 59 year old male with PMH DJD who is presenting with 3 days of right lower back pain. 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 Pain starts in the center of his back and radiates to his right outer hip. Pain described as burning. He experienced shooting pain into his right leg one time, none since. Has had some intermittent numbness in his right leg, none now. States he feels "somewhat" weak in his legs. No problems with bowel/bladder. He has a history of DJD. This was dx'd 3 years ago. Since that time he will have lower back pain that comes and goes. He takes daily meloxicam and flexeril for this. For his current pain he is taking meloxicam, flexeril, excedrin and tylenol and helping some. Also used heating pad which has helped. Pt has tried PT several years ago and helped, he is interested in trying this again.  Review of Systems  Constitutional: Negative for fever and chills.  Gastrointestinal: Negative for nausea, vomiting, abdominal pain and diarrhea.  Genitourinary: Negative for dysuria and difficulty urinating.  Musculoskeletal: Positive for myalgias and back pain.  Skin: Negative for color change.  Neurological: Positive for numbness. Negative for weakness.  Hematological: Negative for adenopathy.   Patient Active Problem List   Diagnosis Date Noted  . Right medial knee pain 11/17/2013  . Spondylosis, cervical, with myelopathy 11/17/2013  . Personal history of colonic adenomas 09/30/2012  . Lumbar radicular pain 08/18/2012  . Unspecified chronic bronchitis 08/12/2012  . Snoring disorder   . Nocturia   . Chest pain 05/28/2011  . Dizziness 05/28/2011  . HEART MURMUR, SYSTOLIC 64/40/3474  . NECK PAIN, RIGHT 06/01/2008  . HYPERTENSION 05/18/2008  . GERD 05/18/2008  . PEPTIC ULCER DISEASE 05/18/2008  . HYPERTROPHY PROSTATE  W/UR OBST & OTH LUTS 05/18/2008  . OSTEOARTHRITIS 05/18/2008   Prior to Admission medications   Medication Sig Start Date End Date Taking? Authorizing Provider  acetaminophen (TYLENOL) 325 MG tablet Take 650 mg by mouth every 6 (six) hours as needed.   Yes Historical Provider, MD  albuterol (VENTOLIN HFA) 108 (90 BASE) MCG/ACT inhaler Inhale 2 puffs into the lungs every 6 (six) hours as needed. 03/17/13  Yes Ryan M Dunn, PA-C  aspirin 81 MG tablet Take 81 mg by mouth daily.   Yes Historical Provider, MD  atorvastatin (LIPITOR) 20 MG tablet Take 1 tablet (20 mg total) by mouth daily. 03/17/13  Yes Ryan M Dunn, PA-C  cyclobenzaprine (FLEXERIL) 5 MG tablet Take 2 tabs bid 03/15/14  Yes Ezekiel Slocumb, PA-C  esomeprazole (NEXIUM) 40 MG capsule Take 1 capsule (40 mg total) by mouth daily before breakfast. 03/17/13  Yes Ryan M Dunn, PA-C  fexofenadine (ALLEGRA) 180 MG tablet Take 180 mg by mouth daily as needed.    Yes Historical Provider, MD  fluticasone (FLONASE) 50 MCG/ACT nasal spray Place 2 sprays into both nostrils daily. 11/17/13 11/17/14 Yes Philmore Pali, NP  gabapentin (NEURONTIN) 300 MG capsule Take 3 tabs po bid 03/17/13  Yes Ryan M Dunn, PA-C  meloxicam (MOBIC) 15 MG tablet Take 1 tablet (15 mg total) by mouth daily. 03/15/14  Yes Bennett Scrape V, PA-C  Omega-3 Fatty Acids (FISH OIL PO) Take 2 tablets by mouth daily. 1000 mg twice daily   Yes Historical Provider, MD  solifenacin (VESICARE) 5 MG tablet Take 2 tablets (10 mg total) by mouth daily. 03/17/13  Yes Ryan M Dunn, PA-C  tamsulosin (FLOMAX) 0.4 MG CAPS capsule Take 1 capsule (0.4 mg total) by mouth daily. 03/17/13  Yes Ryan M Dunn, PA-C  valsartan-hydrochlorothiazide (DIOVAN-HCT) 160-25 MG per tablet Take 1 tablet by mouth daily. 03/17/13  Yes Ryan Lyn Hollingshead, PA-C   Allergies  Allergen Reactions  . Asa [Aspirin]     Reacts as a sleeping pill   Patient's social and family history were reviewed.      Objective:   Physical Exam    Constitutional: He is oriented to person, place, and time. He appears well-developed and well-nourished. No distress.  HENT:  Head: Normocephalic and atraumatic.  Right Ear: Hearing normal.  Left Ear: Hearing normal.  Nose: Nose normal.  Eyes: Conjunctivae and lids are normal. Right eye exhibits no discharge. Left eye exhibits no discharge. No scleral icterus.  Cardiovascular: Normal rate, regular rhythm, normal heart sounds, intact distal pulses and normal pulses.   No murmur heard. Pulmonary/Chest: Effort normal and breath sounds normal. No respiratory distress. He has no wheezes. He has no rhonchi. He has no rales.  Abdominal: Soft. Normal appearance. There is no tenderness. There is no CVA tenderness.  Musculoskeletal: Normal range of motion.       Lumbar back: He exhibits tenderness (right paraspinal). He exhibits normal range of motion and no bony tenderness.  Straight leg raise negative  Neurological: He is alert and oriented to person, place, and time. He has normal strength and normal reflexes. No sensory deficit.  Gait antalgic  Skin: Skin is warm, dry and intact. No lesion and no rash noted.  Psychiatric: He has a normal mood and affect. His speech is normal and behavior is normal. Thought content normal.   BP 142/82 mmHg  Pulse 83  Temp(Src) 97.7 F (36.5 C) (Oral)  Resp 16  Ht 6\' 3"  (1.905 m)  Wt 249 lb (112.946 kg)  BMI 31.12 kg/m2  SpO2 95%     Assessment & Plan:  1. Lumbar spondylosis, unspecified spinal osteoarthritis 2. Lumbar strain, initial encounter Pain is likely of muscular origin. Refilled mobic and flexeril which he takes daily for DJD. He will continue heat and massage at home. Referred to physical therapy. He will return if pain is not improving in 2 weeks. - meloxicam (MOBIC) 15 MG tablet; Take 1 tablet (15 mg total) by mouth daily.  Dispense: 90 tablet; Refill: 3 - cyclobenzaprine (FLEXERIL) 5 MG tablet; Take 2 tabs bid  Dispense: 90 tablet; Refill:  1 - Ambulatory referral to Physical Therapy   [name]. Drenda Freeze, MHS [facility]  03/15/2014

## 2014-03-28 ENCOUNTER — Other Ambulatory Visit: Payer: Self-pay | Admitting: Physician Assistant

## 2014-04-20 ENCOUNTER — Other Ambulatory Visit: Payer: Self-pay

## 2014-04-20 DIAGNOSIS — K219 Gastro-esophageal reflux disease without esophagitis: Secondary | ICD-10-CM

## 2014-04-20 DIAGNOSIS — N400 Enlarged prostate without lower urinary tract symptoms: Secondary | ICD-10-CM

## 2014-04-20 DIAGNOSIS — I10 Essential (primary) hypertension: Secondary | ICD-10-CM

## 2014-04-20 DIAGNOSIS — E785 Hyperlipidemia, unspecified: Secondary | ICD-10-CM

## 2014-04-20 NOTE — Telephone Encounter (Signed)
Send all of his med that were prescribed to him last March by Christell Faith to [facility].    [phone]

## 2014-04-23 MED ORDER — VALSARTAN-HYDROCHLOROTHIAZIDE 160-25 MG PO TABS: 1.0000 | ORAL_TABLET | Freq: Every day | ORAL | Status: DC

## 2014-04-23 MED ORDER — ATORVASTATIN CALCIUM 20 MG PO TABS: 20.0000 mg | ORAL_TABLET | Freq: Every day | ORAL | Status: DC

## 2014-04-23 MED ORDER — ALBUTEROL SULFATE HFA 108 (90 BASE) MCG/ACT IN AERS: 2.0000 | INHALATION_SPRAY | Freq: Four times a day (QID) | RESPIRATORY_TRACT | Status: DC | PRN

## 2014-04-23 MED ORDER — TAMSULOSIN HCL 0.4 MG PO CAPS: 0.4000 mg | ORAL_CAPSULE | Freq: Every day | ORAL | Status: DC

## 2014-04-23 MED ORDER — GABAPENTIN 300 MG PO CAPS: ORAL_CAPSULE | ORAL | Status: DC

## 2014-04-23 MED ORDER — ESOMEPRAZOLE MAGNESIUM 40 MG PO CPDR: 40.0000 mg | DELAYED_RELEASE_CAPSULE | Freq: Every day | ORAL | Status: DC

## 2014-04-23 MED ORDER — SOLIFENACIN SUCCINATE 5 MG PO TABS: 10.0000 mg | ORAL_TABLET | Freq: Every day | ORAL | Status: DC

## 2014-04-23 NOTE — Telephone Encounter (Signed)
I have sent a 90-day supply of his medications to [facility]. There are no refills on them. He needs to RTC and establish care with a new PCP before this supply of medications runs out.  Meds ordered this encounter  Medications  . valsartan-hydrochlorothiazide (DIOVAN-HCT) 160-25 MG per tablet    Sig: Take 1 tablet by mouth daily.    Dispense:  90 tablet    Refill:  0  . tamsulosin (FLOMAX) 0.4 MG CAPS capsule    Sig: Take 1 capsule (0.4 mg total) by mouth daily.    Dispense:  90 capsule    Refill:  0  . solifenacin (VESICARE) 5 MG tablet    Sig: Take 2 tablets (10 mg total) by mouth daily.    Dispense:  90 tablet    Refill:  0  . gabapentin (NEURONTIN) 300 MG capsule    Sig: Take 3 tabs po bid    Dispense:  540 capsule    Refill:  0  . esomeprazole (NEXIUM) 40 MG capsule    Sig: Take 1 capsule (40 mg total) by mouth daily before breakfast.    Dispense:  30 capsule    Refill:  0  . atorvastatin (LIPITOR) 20 MG tablet    Sig: Take 1 tablet (20 mg total) by mouth daily.    Dispense:  90 tablet    Refill:  0  . albuterol (VENTOLIN HFA) 108 (90 BASE) MCG/ACT inhaler    Sig: Inhale 2 puffs into the lungs every 6 (six) hours as needed (cough, shortness of breath or wheezing).    Dispense:  3 each    Refill:  0

## 2014-04-23 NOTE — Telephone Encounter (Signed)
Can I refill all meds or does pt need OV? Rx's pended.

## 2014-04-24 NOTE — Telephone Encounter (Signed)
LMOM for pt that RFs were sent but needs OV to est care w/new PCP before needs more RFs.

## 2014-06-03 ENCOUNTER — Other Ambulatory Visit: Payer: Self-pay | Admitting: Physician Assistant

## 2014-06-18 ENCOUNTER — Other Ambulatory Visit: Payer: Self-pay | Admitting: Physician Assistant

## 2014-06-18 NOTE — Telephone Encounter (Signed)
Please review for refill. Thanks!  

## 2014-06-18 NOTE — Telephone Encounter (Signed)
Will need to contact the patient, if he does not intend on follow up with [place], he will need to get from PCP.

## 2014-06-19 NOTE — Telephone Encounter (Signed)
[place] pt. 

## 2014-06-27 ENCOUNTER — Other Ambulatory Visit: Payer: Self-pay | Admitting: Physician Assistant

## 2014-07-20 LAB — PULMONARY FUNCTION TEST

## 2014-07-20 LAB — BASIC METABOLIC PANEL
BUN: 27 mg/dL — AB (ref 4–21)
Creatinine: 1.1 mg/dL (ref 0.6–1.3)
Glucose: 124 mg/dL
Potassium: 4.2 mmol/L (ref 3.4–5.3)
SODIUM: 141 mmol/L (ref 137–147)

## 2014-07-20 LAB — HEPATIC FUNCTION PANEL
ALT: 43 U/L — AB (ref 10–40)
AST: 23 U/L (ref 14–40)
Alkaline Phosphatase: 84 U/L (ref 25–125)
Bilirubin, Total: 0.5 mg/dL

## 2014-07-20 LAB — LIPID PANEL
CHOLESTEROL: 173 mg/dL (ref 0–200)
HDL: 50 mg/dL (ref 35–70)
LDL CALC: 80 mg/dL
TRIGLYCERIDES: 216 mg/dL — AB (ref 40–160)

## 2014-07-20 LAB — CBC AND DIFFERENTIAL
HEMATOCRIT: 44 % (ref 41–53)
HEMOGLOBIN: 14.6 g/dL (ref 13.5–17.5)
Platelets: 212 10*3/uL (ref 150–399)
WBC: 7.7 10^3/mL

## 2014-09-10 ENCOUNTER — Other Ambulatory Visit: Payer: Self-pay | Admitting: Physician Assistant

## 2014-09-14 ENCOUNTER — Telehealth (HOSPITAL_COMMUNITY): Payer: Self-pay

## 2014-09-14 NOTE — Telephone Encounter (Signed)
I ask Dr. Krista Blue - said she could see him on Friday 11/02/14 at 11:30am - thank you.

## 2014-09-14 NOTE — Telephone Encounter (Signed)
Called pt to r/s apt from 11/23/2014..he can ONLY come in on Fridays...what do you want him to do?  Please advise

## 2014-09-20 NOTE — Telephone Encounter (Signed)
I finally got a hold of [place] and he can not due 11:30am as he has another apt with another dr on 10/28 but can do 2pm..is that do able?  If not please let me know...if yes please add to schedule as I can not override apts

## 2014-10-03 ENCOUNTER — Other Ambulatory Visit: Payer: Self-pay | Admitting: Physician Assistant

## 2014-10-05 ENCOUNTER — Ambulatory Visit (INDEPENDENT_AMBULATORY_CARE_PROVIDER_SITE_OTHER): Payer: 59 | Admitting: Neurology

## 2014-10-05 ENCOUNTER — Encounter: Payer: Self-pay | Admitting: Neurology

## 2014-10-05 VITALS — BP 138/84 | HR 72 | Resp 16 | Ht 75.0 in | Wt 248.0 lb

## 2014-10-05 DIAGNOSIS — M5416 Radiculopathy, lumbar region: Secondary | ICD-10-CM | POA: Diagnosis not present

## 2014-10-05 DIAGNOSIS — N3941 Urge incontinence: Secondary | ICD-10-CM | POA: Diagnosis not present

## 2014-10-05 NOTE — Progress Notes (Signed)
Chief Complaint  Patient presents with  . Neck Pain    Sts. neck pain is about the same, lbp is some worse.  Sts. he has new c/o "electrical shock" sensation right anterior/medial thigh. Sts. he is taking Gabapentin as rx'd/fim  . Back Pain      PATIENT: Benjamin Conner DOB: 1955/07/28   HISTORY OF PRESENT ILLNESS: Mr. Yellin is a 59 years old male, returned for follow-up of low back pain, radiating pain to right leg  He was patient of Dr. love, complained of right thumb and index finger numbness associated with neck pain  in 2005 which was evaluated with a normal EMG/NCV 07/26/2008 of the left and right upper extremities.    He has a history of lower back pain since 2011 extending to the right knee occurred mostly while standing. I Evaluation with lumbar spine x-rays 11/01/09 showed lumbar scoliosis to the left and narrowing of the L3-4, L4-5, and L5-S1 disc spaces, worse at L5-S1. he denies bowel or bladder incontinence. W MRI of the cervical spine 02/10/2003 showed diffuse bulge of the  C5-6 disc and tiny central bulge at T2-3.   Last clinical visit was 2011, he continue have slow worsening low back pain, occasionally radiating pain to right median thigh, right knee level, he noticed difficulty clear his right foot from floor, mild worsening urinary urgency, occasionally incontinence. He is taking Vesicare,  He denies bilateral upper extremity paresthesia or weakness.  REVIEW OF SYSTEMS: Full 14 system review of systems performed and notable only for: Shortness of breath, environmental allergy  ALLERGIES: Allergies  Allergen Reactions  . Asa [Aspirin]     Reacts as a sleeping pill    HOME MEDICATIONS: Outpatient Prescriptions Prior to Visit  Medication Sig Dispense Refill  . albuterol (VENTOLIN HFA) 108 (90 BASE) MCG/ACT inhaler Inhale 2 puffs into the lungs every 6 (six) hours as needed (cough, shortness of breath or wheezing). 3 each 0  . aspirin 81 MG tablet Take 81 mg by mouth  daily.    Marland Kitchen atorvastatin (LIPITOR) 20 MG tablet Take 1 tablet (20 mg total) by mouth daily. 90 tablet 0  . cyclobenzaprine (FLEXERIL) 5 MG tablet Take 2 tabs bid 90 tablet 1  . esomeprazole (NEXIUM) 40 MG capsule Take 1 capsule (40 mg total) by mouth daily. NO MORE REFILLS WITHOUT OFFICE VISIT - 2ND NOTICE 15 capsule 0  . fexofenadine (ALLEGRA) 180 MG tablet Take 180 mg by mouth daily as needed.     . fluticasone (FLONASE) 50 MCG/ACT nasal spray Place 2 sprays into both nostrils daily. 48 g 3  . gabapentin (NEURONTIN) 300 MG capsule Take 3 tabs po bid 540 capsule 0  . meloxicam (MOBIC) 15 MG tablet Take 1 tablet (15 mg total) by mouth daily. 90 tablet 3  . Omega-3 Fatty Acids (FISH OIL PO) Take 2 tablets by mouth daily. 1000 mg twice daily    . solifenacin (VESICARE) 5 MG tablet Take 2 tablets (10 mg total) by mouth daily. 90 tablet 0  . tamsulosin (FLOMAX) 0.4 MG CAPS capsule Take 1 capsule (0.4 mg total) by mouth daily. 90 capsule 0  . acetaminophen (TYLENOL) 325 MG tablet Take 650 mg by mouth every 6 (six) hours as needed.    . valsartan-hydrochlorothiazide (DIOVAN-HCT) 160-25 MG per tablet Take 1 tablet by mouth daily. 90 tablet 0   No facility-administered medications prior to visit.    PHYSICAL EXAM Filed Vitals:   10/05/14 0902  BP: 138/84  Pulse:  72  Resp: 16  Height: 6\' 3"  (1.905 m)  Weight: 248 lb (112.492 kg)   Body mass index is 31 kg/(m^2).   PHYSICAL EXAMNIATION:  Gen: NAD, conversant, well nourised, obese, well groomed                     Cardiovascular: Regular rate rhythm, no peripheral edema, warm, nontender. Eyes: Conjunctivae clear without exudates or hemorrhage Neck: Supple, no carotid bruise. Pulmonary: Clear to auscultation bilaterally   NEUROLOGICAL EXAM:  MENTAL STATUS: Speech:    Speech is normal; fluent and spontaneous with normal comprehension.  Cognition:     Orientation to time, place and person     Normal recent and remote memory     Normal  Attention span and concentration     Normal Language, naming, repeating,spontaneous speech     Fund of knowledge   CRANIAL NERVES: CN II: Visual fields are full to confrontation. Fundoscopic exam is normal with sharp discs and no vascular changes. Pupils are round equal and briskly reactive to light. CN III, IV, VI: extraocular movement are normal. No ptosis. CN V: Facial sensation is intact to pinprick in all 3 divisions bilaterally. Corneal responses are intact.  CN VII: Face is symmetric with normal eye closure and smile. CN VIII: Hearing is normal to rubbing fingers CN IX, X: Palate elevates symmetrically. Phonation is normal. CN XI: Head turning and shoulder shrug are intact CN XII: Tongue is midline with normal movements and no atrophy.  MOTOR: Muscle bulk and tone are normal.  He has mild right ankle, toe extension/flexion weakness.  REFLEXES: Reflexes are 2+ and symmetric at the biceps, triceps, knees, and absent at ankles. Plantar responses are flexor.  SENSORY: Length dependent decreased to light touch, pinprick at mid shin level, he has decreased vibration sense to mid shin.  COORDINATION: Rapid alternating movements and fine finger movements are intact. There is no dysmetria on finger-to-nose and heel-knee-shin.    GAIT/STANCE: Mild right foot drop, difficulty perform heel walking, tip toe walking, right worse than left.   ASSESSMENT/PLAN: 59 y.o. year old male with past medical history of hypertension, prostate hypertrophy, hyperlipidemia,  Worsening low back pain, urinary urgency  Most consistent with right lumbar radiculopathy  MRI of lumbar  EMG nerve conduction study  Keep gabapentin 300 mg 3 times a day, Mobic as needed  Marcial Pacas, M.D. Ph.D.  [facility] [place], [place] [address] Phone: [phone] Fax:      [phone]

## 2014-11-02 LAB — BASIC METABOLIC PANEL
BUN: 26 mg/dL — AB (ref 4–21)
Creatinine: 1.1 mg/dL (ref 0.6–1.3)
Glucose: 123 mg/dL
POTASSIUM: 4.6 mmol/L (ref 3.4–5.3)
Sodium: 141 mmol/L (ref 137–147)

## 2014-11-02 LAB — HEPATIC FUNCTION PANEL
ALT: 50 U/L — AB (ref 10–40)
AST: 26 U/L (ref 14–40)
Alkaline Phosphatase: 79 U/L (ref 25–125)
BILIRUBIN, TOTAL: 0.4 mg/dL

## 2014-11-02 LAB — LIPID PANEL
Cholesterol: 179 mg/dL (ref 0–200)
HDL: 56 mg/dL (ref 35–70)
LDL CALC: 76 mg/dL
TRIGLYCERIDES: 235 mg/dL — AB (ref 40–160)

## 2014-11-02 LAB — CBC AND DIFFERENTIAL
HEMATOCRIT: 43 % (ref 41–53)
Hemoglobin: 14.3 g/dL (ref 13.5–17.5)
Platelets: 229 10*3/uL (ref 150–399)
WBC: 7.4 10^3/mL

## 2014-11-05 ENCOUNTER — Encounter: Payer: 59 | Admitting: Neurology

## 2014-11-23 ENCOUNTER — Ambulatory Visit: Payer: 59 | Admitting: Neurology

## 2014-12-05 ENCOUNTER — Telehealth: Payer: Self-pay

## 2014-12-05 NOTE — Telephone Encounter (Signed)
Called all numbers work and mobile and no answer, left messages..Also called wife and left message there too...need to see if he can come in tomorrow (12/1) for NCV/EMG...please advise

## 2014-12-18 ENCOUNTER — Other Ambulatory Visit: Payer: Self-pay | Admitting: Physician Assistant

## 2015-02-01 LAB — LIPID PANEL
Cholesterol: 132 mg/dL (ref 0–200)
HDL: 32 mg/dL — AB (ref 35–70)
LDL CALC: 118 mg/dL
Triglycerides: 590 mg/dL — AB (ref 40–160)

## 2015-02-01 LAB — BASIC METABOLIC PANEL
BUN: 20 mg/dL (ref 4–21)
CREATININE: 1.2 mg/dL (ref 0.6–1.3)
Glucose: 150 mg/dL
Potassium: 4.6 mmol/L (ref 3.4–5.3)
Sodium: 139 mmol/L (ref 137–147)

## 2015-02-01 LAB — HEPATIC FUNCTION PANEL
ALK PHOS: 85 U/L (ref 25–125)
ALT: 42 U/L — AB (ref 10–40)
AST: 26 U/L (ref 14–40)

## 2015-02-01 LAB — CBC AND DIFFERENTIAL
HCT: 45 % (ref 41–53)
Hemoglobin: 15.1 g/dL (ref 13.5–17.5)
Platelets: 218 10*3/uL (ref 150–399)
WBC: 7.1 10*3/mL

## 2015-02-01 LAB — PSA: PSA: 1.06

## 2015-05-03 LAB — HEPATIC FUNCTION PANEL
ALT: 51 U/L — AB (ref 10–40)
AST: 43 U/L — AB (ref 14–40)
Alkaline Phosphatase: 83 U/L (ref 25–125)

## 2015-05-03 LAB — BASIC METABOLIC PANEL WITH GFR
BUN: 19 mg/dL (ref 4–21)
Creatinine: 1.2 mg/dL (ref 0.6–1.3)
Glucose: 186 mg/dL
Potassium: 4.1 mmol/L (ref 3.4–5.3)
Sodium: 137 mmol/L (ref 137–147)

## 2015-05-03 LAB — CBC AND DIFFERENTIAL
HCT: 43 % (ref 41–53)
Hemoglobin: 14.8 g/dL (ref 13.5–17.5)
Platelets: 263 K/µL (ref 150–399)
WBC: 8.1 10*3/mL

## 2016-03-12 NOTE — Progress Notes (Signed)
HPI:   [name] is a 61 y.o. male, who is here today to establish care.  Former PCP: Dr Nancy Fetter. Last preventive routine visit: 2017.  Chronic medical problems: Back pain and DDD,HTN,PUD, BPH,and OA among some.  He follows with neurology, Dr Krista Blue, for cervical pain and lower back pain radiated to RLE.Last appt with neuro 09/2014.  Cervical spine MRI 02/2003:  1. Diffuse bulge of the C5-6 disk without discrete impingement.   2. Tiny central bulge at T2-3. Otherwise normal exam.        Hypertension:   Dx around 2000. On Valsartan-HCT 160-25 mg daily.   He has not been taking medication for a month. He is not checking BP at home.  He has not noted unusual headache, visual changes, exertional chest pain, worsening dyspnea, focal weakness, or edema.   Lab Results  Component Value Date   CREATININE 0.90 03/17/2013   BUN 18 03/17/2013   NA 140 03/17/2013   K 4.2 03/17/2013   CL 103 03/17/2013   CO2 28 03/17/2013     Hyperlipidemia:  He was on Lipitor 20 mg daily,ran out about a month. Following a low fat diet: Yes, not consistently.  He did not noted side effects with medication.  Lab Results  Component Value Date   CHOL 223 (H) 07/25/2012   HDL 45 07/25/2012   LDLCALC 128 (H) 07/25/2012   TRIG 250 (H) 07/25/2012   CHOLHDL 5.0 07/25/2012   COPD:  Former smoker. He uses Albuterol inh daily because wheezing, stable exertional dyspnea. Denies hemoptysis,abnormal wt loss, or fever.  Concerns today:  -ED: For about a year ago. Initially he noted difficulty maintaining and progressed to not been able to do so. For the past month he has noted that his penis looks bending down with erections, it does not seem straight, he denies associated pain. He denies any injury, skin lesions, or urethral discharge. BPH: On Flomax and Vesicare, also ran out a month ago, + urinary dribbling and frequency. He denies dysuria ,gross hematuria,or decreased urine  output.   ? DM. He also mentions that his former PCP was concerned about elevated glucose. Last labs done 10/2015, recommended to follow low sugar diet.  -Skin rash: Pruritis, on lower and upper extremities as well as lower back.He has had this rash for years, not aware of hx of eczema.He has Hx of allergic rhinitis. He has had topical steroid prescription which helped,not sure about name.   Review of Systems  Constitutional: Negative for activity change, appetite change, fatigue, fever and unexpected weight change.  HENT: Negative for mouth sores, nosebleeds, sore throat and trouble swallowing.   Eyes: Negative for pain, redness and visual disturbance.  Respiratory: Positive for shortness of breath and wheezing. Negative for cough.   Cardiovascular: Negative for chest pain, palpitations and leg swelling.  Gastrointestinal: Negative for abdominal pain, nausea and vomiting.  Endocrine: Negative for cold intolerance, heat intolerance, polydipsia, polyphagia and polyuria.  Genitourinary: Negative for decreased urine volume, discharge, dysuria, hematuria and penile pain.  Musculoskeletal: Positive for back pain and neck pain. Negative for gait problem.  Skin: Negative for rash.  Allergic/Immunologic: Positive for environmental allergies.  Neurological: Negative for seizures, weakness and headaches.  Psychiatric/Behavioral: Negative for confusion. The patient is not nervous/anxious.       Current Outpatient Prescriptions on File Prior to Visit  Medication Sig Dispense Refill  . acetaminophen (TYLENOL) 325 MG tablet Take 650 mg by mouth every 6 (six) hours  as needed.    Marland Kitchen albuterol (VENTOLIN HFA) 108 (90 BASE) MCG/ACT inhaler Inhale 2 puffs into the lungs every 6 (six) hours as needed (cough, shortness of breath or wheezing). 3 each 0  . aspirin 81 MG tablet Take 81 mg by mouth daily.    . cyclobenzaprine (FLEXERIL) 5 MG tablet Take 2 tabs bid 90 tablet 1  . esomeprazole (NEXIUM) 40 MG  capsule Take 1 capsule (40 mg total) by mouth daily. NO MORE REFILLS WITHOUT OFFICE VISIT - 2ND NOTICE 15 capsule 0  . fexofenadine (ALLEGRA) 180 MG tablet Take 180 mg by mouth daily as needed.     . gabapentin (NEURONTIN) 300 MG capsule Take 3 tabs po bid 540 capsule 0  . fluticasone (FLONASE) 50 MCG/ACT nasal spray Place 2 sprays into both nostrils daily. 48 g 3   No current facility-administered medications on file prior to visit.      Past Medical History:  Diagnosis Date  . BACK PAIN    chronic  . COPD (chronic obstructive pulmonary disease) ([place])   . GERD   . Hyperlipidemia   . HYPERTENSION   . HYPERTROPHY PROSTATE W/UR OBST & OTH LUTS   . Nocturia   . Numbness of hand    since 2005  . OSTEOARTHRITIS   . PEPTIC ULCER DISEASE   . Personal history of colonic adenomas 09/30/2012  . Pneumonia   . Snoring disorder    Allergies  Allergen Reactions  . Asa [Aspirin]     Reacts as a sleeping pill    Family History  Problem Relation Age of Onset  . Heart disease Father   . Stroke Father   . COPD Father   . Coronary artery disease Mother     Stent at age 40  . Hypertension Mother   . High Cholesterol Mother   . Cancer Mother     ovarian  . Stroke Mother   . Diabetes Mother   . Hypertension Brother   . Diabetes Brother     Social History   Social History  . Marital status: Married    Spouse name: Benjamin Conner  . Number of children: 2  . Years of education: 12   Occupational History  . Cashier     [employer]   Social History Main Topics  . Smoking status: Former Smoker    Packs/day: 1.00    Years: 36.00    Types: Cigarettes    Start date: 01/06/1971    Quit date: 01/06/2007  . Smokeless tobacco: Never Used     Comment: quit 8\2009  . Alcohol use 1.8 oz/week    3 Shots of liquor per week     Comment: 2-3 daily  . Drug use: No  . Sexual activity: Yes   Other Topics Concern  . None   Social History Narrative   Patient is married to [employer]. Patient  has a high school education. Patient is a Scientist, water quality for [employer].   Caffeine- four daily.   Left handed.    Vitals:   03/13/16 1022  BP: (!) 160/90  Pulse: 67  Resp: 12   O2sat at RA 95% Body mass index is 30.26 kg/m.   Physical Exam  Nursing note and vitals reviewed. Constitutional: He is oriented to person, place, and time. He appears well-developed. No distress.  HENT:  Head: Atraumatic.  Mouth/Throat: Oropharynx is clear and moist and mucous membranes are normal.  Eyes: Conjunctivae and EOM are normal. Pupils are equal, round, and  reactive to light.  Neck: No JVD present.  Cardiovascular: Normal rate and regular rhythm.   Murmur (? soft SEM RUSB) heard. Pulses:      Dorsalis pedis pulses are 2+ on the right side, and 2+ on the left side.  Respiratory: Effort normal and breath sounds normal. No respiratory distress.  GI: Soft. He exhibits no mass. There is no hepatomegaly. There is no tenderness.  Genitourinary: Right testis shows no tenderness. Left testis shows no tenderness. No penile erythema or penile tenderness. No discharge found.  Genitourinary Comments: Right testicle seems enlarged,no tenderness or masses appreciated.  Musculoskeletal: He exhibits edema (1+ pitting edema LE bilateral). He exhibits no tenderness.  Lymphadenopathy:    He has no cervical adenopathy.       Right: No inguinal adenopathy present.       Left: No inguinal adenopathy present.  Neurological: He is alert and oriented to person, place, and time. He has normal strength. Coordination and gait normal.  Skin: Skin is warm. No rash noted. No erythema.     Psychiatric: He has a normal mood and affect. Cognition and memory are normal.  Well groomed, good eye contact.      ASSESSMENT AND PLAN:   Benjamin Conner was seen today for establish care.  Diagnoses and all orders for this visit:  Lab Results  Component Value Date   HGBA1C 7.1 (H) 03/13/2016   Lab Results  Component Value Date    CREATININE 1.07 03/13/2016   BUN 26 (H) 03/13/2016   NA 139 03/13/2016   K 4.0 03/13/2016   CL 108 03/13/2016   CO2 23 03/13/2016    Penile erection impairment  ? Peyronie's disease. Urology referral placed.  Essential hypertension  Not well controlled. Possible complications of elevated BP discussed. Resume Diovan HCT. Monitor BP at home.  Annual eye examination. F/U in 3-4 months.  -     Basic metabolic panel -     Hemoglobin A1c -     valsartan-hydrochlorothiazide (DIOVAN-HCT) 160-25 MG tablet; Take 1 tablet by mouth daily.  Benign prostatic hyperplasia with post-void dribbling  Symptomatic. Resume Flomax and Vesicare. Continue following with urologists.  -     Ambulatory referral to Urology -     tamsulosin (FLOMAX) 0.4 MG CAPS capsule; Take 1 capsule (0.4 mg total) by mouth daily. -     solifenacin (VESICARE) 5 MG tablet; Take 2 tablets (10 mg total) by mouth daily.  Hyperlipidemia, unspecified hyperlipidemia type  No changes in current management, will follow labs done today and will give further recommendations accordingly. F/U in 6 months.  -     Lipid panel -     atorvastatin (LIPITOR) 20 MG tablet; Take 1 tablet (20 mg total) by mouth daily. -     LDL cholesterol, direct  Chronic obstructive pulmonary disease, unspecified COPD type ([place])  Not well controlled. Symbicort started today after discussion of treatment options, he will let me know if his insurance do not cover this med. Albuterol inh to continue. F/U in 4-5 months,before if needed.  -     budesonide-formoterol (SYMBICORT) 80-4.5 MCG/ACT inhaler; Inhale 2 puffs into the lungs 2 (two) times daily.  Pruritic erythematous rash  ? Eczema. Topical steroid side effects discussed. Daily moisturizer. F/U as needed.  -     triamcinolone ointment (KENALOG) 0.5 %; Apply 1 application topically 2 (two) times daily as needed.      [name]. Martinique, MD  [facility]. [place]  office.

## 2016-03-13 ENCOUNTER — Encounter: Payer: Self-pay | Admitting: Family Medicine

## 2016-03-13 ENCOUNTER — Ambulatory Visit (INDEPENDENT_AMBULATORY_CARE_PROVIDER_SITE_OTHER): Payer: 59 | Admitting: Family Medicine

## 2016-03-13 VITALS — BP 160/90 | HR 67 | Resp 12 | Ht 75.0 in | Wt 242.1 lb

## 2016-03-13 DIAGNOSIS — J449 Chronic obstructive pulmonary disease, unspecified: Secondary | ICD-10-CM

## 2016-03-13 DIAGNOSIS — N401 Enlarged prostate with lower urinary tract symptoms: Secondary | ICD-10-CM

## 2016-03-13 DIAGNOSIS — E1169 Type 2 diabetes mellitus with other specified complication: Secondary | ICD-10-CM | POA: Insufficient documentation

## 2016-03-13 DIAGNOSIS — E785 Hyperlipidemia, unspecified: Secondary | ICD-10-CM | POA: Diagnosis not present

## 2016-03-13 DIAGNOSIS — I10 Essential (primary) hypertension: Secondary | ICD-10-CM

## 2016-03-13 DIAGNOSIS — L298 Other pruritus: Secondary | ICD-10-CM

## 2016-03-13 DIAGNOSIS — N529 Male erectile dysfunction, unspecified: Secondary | ICD-10-CM

## 2016-03-13 DIAGNOSIS — N3943 Post-void dribbling: Secondary | ICD-10-CM

## 2016-03-13 DIAGNOSIS — E119 Type 2 diabetes mellitus without complications: Secondary | ICD-10-CM | POA: Insufficient documentation

## 2016-03-13 LAB — BASIC METABOLIC PANEL
BUN: 26 mg/dL — AB (ref 6–23)
CHLORIDE: 108 meq/L (ref 96–112)
CO2: 23 meq/L (ref 19–32)
CREATININE: 1.07 mg/dL (ref 0.40–1.50)
Calcium: 10.4 mg/dL (ref 8.4–10.5)
GFR: 74.9 mL/min (ref >60.00)
Glucose, Bld: 186 mg/dL — ABNORMAL HIGH (ref 70–99)
POTASSIUM: 4 meq/L (ref 3.5–5.1)
Sodium: 139 mEq/L (ref 135–145)

## 2016-03-13 LAB — LIPID PANEL
Cholesterol: 225 mg/dL — ABNORMAL HIGH (ref 0–200)
HDL: 43.1 mg/dL (ref >39.00)
Total CHOL/HDL Ratio: 5
Triglycerides: 818 mg/dL — ABNORMAL HIGH (ref 0.0–149.0)

## 2016-03-13 LAB — HEMOGLOBIN A1C: HEMOGLOBIN A1C: 7.1 % — AB (ref 4.6–6.5)

## 2016-03-13 LAB — LDL CHOLESTEROL, DIRECT: LDL DIRECT: 104 mg/dL

## 2016-03-13 MED ORDER — TAMSULOSIN HCL 0.4 MG PO CAPS: 0.4000 mg | ORAL_CAPSULE | Freq: Every day | ORAL | 1 refills | Status: DC

## 2016-03-13 MED ORDER — ATORVASTATIN CALCIUM 20 MG PO TABS: 20.0000 mg | ORAL_TABLET | Freq: Every day | ORAL | 0 refills | Status: DC

## 2016-03-13 MED ORDER — VALSARTAN-HYDROCHLOROTHIAZIDE 160-25 MG PO TABS: 1.0000 | ORAL_TABLET | Freq: Every day | ORAL | 1 refills | Status: DC

## 2016-03-13 MED ORDER — TRIAMCINOLONE ACETONIDE 0.5 % EX OINT: 1.0000 "application " | TOPICAL_OINTMENT | Freq: Two times a day (BID) | CUTANEOUS | 1 refills | Status: DC | PRN

## 2016-03-13 MED ORDER — BUDESONIDE-FORMOTEROL FUMARATE 80-4.5 MCG/ACT IN AERO: 2.0000 | INHALATION_SPRAY | Freq: Two times a day (BID) | RESPIRATORY_TRACT | 3 refills | Status: DC

## 2016-03-13 MED ORDER — SOLIFENACIN SUCCINATE 5 MG PO TABS: 10.0000 mg | ORAL_TABLET | Freq: Every day | ORAL | 1 refills | Status: DC

## 2016-03-13 NOTE — Progress Notes (Signed)
Pre visit review using our clinic review tool, if applicable. No additional management support is needed unless otherwise documented below in the visit note. 

## 2016-03-13 NOTE — Patient Instructions (Signed)
A few things to remember from today's visit:   Essential hypertension - Plan: Basic metabolic panel, Hemoglobin A1c, valsartan-hydrochlorothiazide (DIOVAN-HCT) 160-25 MG tablet  Benign prostatic hyperplasia with post-void dribbling - Plan: Ambulatory referral to Urology, tamsulosin (FLOMAX) 0.4 MG CAPS capsule, solifenacin (VESICARE) 5 MG tablet  Hyperlipidemia, unspecified hyperlipidemia type - Plan: Lipid panel, atorvastatin (LIPITOR) 20 MG tablet  Chronic obstructive pulmonary disease, unspecified COPD type ([place])  Blood pressure goal for most people is less than 140/90. Some populations (older than 60) the goal is less than 150/90.  Most recent cardiologists' recommendations recommend blood pressure at or less than 130/80.   Elevated blood pressure increases the risk of strokes, heart and kidney disease, and eye problems. Regular physical activity and a healthy diet (DASH diet) usually help. Low salt diet. Take medications as instructed.  Caution with some over the counter medications as cold medications, dietary products (for weight loss), and Ibuprofen or Aleve (frequent use);all these medications could cause elevation of blood pressure.   We have ordered labs or studies at this visit.  It can take up to 1-2 weeks for results and processing. IF results require follow up or explanation, we will call you with instructions. Clinically stable results will be released to your [facility]. If you have not heard from [place] or cannot find your results in [place] in 2 weeks please contact our office at [phone].  If you are not yet signed up for [facility], please consider signing up  Please be sure medication list is accurate. If a new problem present, please set up appointment sooner than planned today.

## 2016-03-14 ENCOUNTER — Encounter: Payer: Self-pay | Admitting: Family Medicine

## 2016-03-14 DIAGNOSIS — E785 Hyperlipidemia, unspecified: Secondary | ICD-10-CM

## 2016-03-14 DIAGNOSIS — E1169 Type 2 diabetes mellitus with other specified complication: Secondary | ICD-10-CM | POA: Insufficient documentation

## 2016-03-17 ENCOUNTER — Telehealth: Payer: Self-pay

## 2016-03-17 NOTE — Telephone Encounter (Signed)
We have this discussion during OV, he was aware about medication not being covered by his insurance. This is why just #30 tabs were sent to his pharmacy.He has appt with urologists in process.  If he wants to try another medication in the meantime, Oxybutynin ER 5 mg daily (#30/0) can be sent, I am not sure if he tried this one before.    Thanks, BJ

## 2016-03-17 NOTE — Telephone Encounter (Signed)
Left voicemail for patient to call the office back.   

## 2016-03-17 NOTE — Telephone Encounter (Signed)
Insurance company prefers: Oxybutynin Oxybutynin ER Toviaz  Oxytrol  Instead of Vesicare.

## 2016-03-18 ENCOUNTER — Other Ambulatory Visit: Payer: Self-pay

## 2016-03-18 MED ORDER — OXYBUTYNIN CHLORIDE ER 5 MG PO TB24: 5.0000 mg | ORAL_TABLET | Freq: Every day | ORAL | 0 refills | Status: DC

## 2016-03-18 MED ORDER — METFORMIN HCL 500 MG PO TABS: 500.0000 mg | ORAL_TABLET | Freq: Two times a day (BID) | ORAL | 2 refills | Status: DC

## 2016-03-18 NOTE — Telephone Encounter (Signed)
Spoke with patient, he agrees to try the Oxybutynin. Rx sent.

## 2016-04-14 ENCOUNTER — Other Ambulatory Visit: Payer: Self-pay | Admitting: Family Medicine

## 2016-04-17 ENCOUNTER — Telehealth: Payer: Self-pay

## 2016-04-17 DIAGNOSIS — N401 Enlarged prostate with lower urinary tract symptoms: Principal | ICD-10-CM

## 2016-04-17 DIAGNOSIS — N3943 Post-void dribbling: Secondary | ICD-10-CM

## 2016-04-17 DIAGNOSIS — J449 Chronic obstructive pulmonary disease, unspecified: Secondary | ICD-10-CM

## 2016-04-17 MED ORDER — BUDESONIDE-FORMOTEROL FUMARATE 80-4.5 MCG/ACT IN AERO: 2.0000 | INHALATION_SPRAY | Freq: Two times a day (BID) | RESPIRATORY_TRACT | 1 refills | Status: DC

## 2016-04-17 MED ORDER — METFORMIN HCL 500 MG PO TABS: 500.0000 mg | ORAL_TABLET | Freq: Two times a day (BID) | ORAL | 1 refills | Status: DC

## 2016-04-17 MED ORDER — TAMSULOSIN HCL 0.4 MG PO CAPS: 0.4000 mg | ORAL_CAPSULE | Freq: Every day | ORAL | 1 refills | Status: DC

## 2016-04-17 NOTE — Telephone Encounter (Signed)
Rx's sent as requested. 

## 2016-05-01 LAB — BASIC METABOLIC PANEL
BUN: 19 mg/dL (ref 4–21)
CREATININE: 1.1 mg/dL (ref 0.6–1.3)
Glucose: 116 mg/dL
POTASSIUM: 4.2 mmol/L (ref 3.4–5.3)
Sodium: 137 mmol/L (ref 137–147)

## 2016-05-01 LAB — PSA: PSA: 1.16

## 2016-05-05 ENCOUNTER — Encounter: Payer: Self-pay | Admitting: Family Medicine

## 2016-05-06 ENCOUNTER — Other Ambulatory Visit: Payer: Self-pay | Admitting: Family Medicine

## 2016-05-06 DIAGNOSIS — E785 Hyperlipidemia, unspecified: Secondary | ICD-10-CM

## 2016-05-08 ENCOUNTER — Other Ambulatory Visit: Payer: Self-pay | Admitting: Physician Assistant

## 2016-05-12 ENCOUNTER — Encounter: Payer: Self-pay | Admitting: Family Medicine

## 2016-05-18 ENCOUNTER — Other Ambulatory Visit: Payer: Self-pay | Admitting: Family Medicine

## 2016-05-18 ENCOUNTER — Other Ambulatory Visit: Payer: Self-pay

## 2016-05-18 MED ORDER — ATORVASTATIN CALCIUM 40 MG PO TABS: 40.0000 mg | ORAL_TABLET | Freq: Every day | ORAL | 1 refills | Status: DC

## 2016-05-18 NOTE — Telephone Encounter (Signed)
We did not address this medication during OV.  I think this Rx is filled by neurologists for radicular pain.   Thanks, BJ

## 2016-06-22 ENCOUNTER — Other Ambulatory Visit: Payer: Self-pay | Admitting: Family Medicine

## 2016-07-16 NOTE — Progress Notes (Signed)
HPI:   [name] is a 61 y.o. male, who is here today to follow on some chronic medical problems.  He was last seen on 03/13/16. Since his last OV he has followed with urologists and has a f/u appt next month.   COPD: Currently he is on Symbicort 80-4.5 mcg bid, which has helped, denies side effects. He is using Albuterol inh about 0-3 times per week.  Exertional dyspnea has improved greatly and states that seldom he has symptoms.   Hypertension:  Dx in 2010.  Currently on Valsartan-HCT 160-25 mg daily. BP readings: Not checking. Last eye exam: 2 years ago.  Following low salt diet: Yes.  He is taking medications as instructed, no side effects reported.  He denies headache, visual changes, exertional chest pain, dyspnea,  focal weakness, or edema.   Lab Results  Component Value Date   CREATININE 1.1 05/01/2016   BUN 19 05/01/2016   NA 137 05/01/2016   K 4.2 05/01/2016   CL 108 03/13/2016   CO2 23 03/13/2016    DM II  Dx in 2017.  Currently on Metformin 500 mg bid.  Checking BS's : Not checking.  He is tolerating medications well. He denies abdominal pain, nausea, vomiting, polydipsia, polyuria, or polyphagia. Denies numbness or burning but for about 1-2 months he has "stinging" sensation, plantar,bilateral. It is exacerbated by prolonged walking and attributed to walking on cement.   Lab Results  Component Value Date   HGBA1C 7.1 (H) 03/13/2016    Hyperlipidemia:  Currently on Lipitor 40 mg, increased in 03/2016 from 20 mg. Following a low fat diet: Yes.  He has not noted side effects with medication.  Lab Results  Component Value Date   CHOL 225 (H) 03/13/2016   HDL 43.10 03/13/2016   LDLCALC 118 02/01/2015   LDLDIRECT 104.0 03/13/2016   TRIG (H) 03/13/2016    818.0 Triglyceride is over 400; calculations on Lipids are invalid.   CHOLHDL 5 03/13/2016     Review of Systems  Constitutional: Negative for activity change,  appetite change, fatigue, fever and unexpected weight change.  HENT: Negative for mouth sores, nosebleeds, sore throat and trouble swallowing.   Eyes: Negative for redness and visual disturbance.  Respiratory: Negative for cough, shortness of breath and wheezing.   Cardiovascular: Negative for chest pain, palpitations and leg swelling.  Gastrointestinal: Negative for abdominal pain, nausea and vomiting.       No changes in bowel habits.  Endocrine: Negative for polydipsia, polyphagia and polyuria.  Genitourinary: Negative for decreased urine volume and hematuria.  Musculoskeletal: Negative for gait problem and myalgias.  Skin: Negative for rash and wound.  Neurological: Negative for dizziness, weakness and headaches.  Psychiatric/Behavioral: Negative for confusion. The patient is not nervous/anxious.       Current Outpatient Prescriptions on File Prior to Visit  Medication Sig Dispense Refill  . acetaminophen (TYLENOL) 325 MG tablet Take 650 mg by mouth every 6 (six) hours as needed.    Marland Kitchen aspirin 81 MG tablet Take 81 mg by mouth daily.    . Cholecalciferol (VITAMIN D PO) Take by mouth.    . cyclobenzaprine (FLEXERIL) 5 MG tablet Take 2 tabs bid 90 tablet 1  . esomeprazole (NEXIUM) 40 MG capsule Take 1 capsule (40 mg total) by mouth daily. NO MORE REFILLS WITHOUT OFFICE VISIT - 2ND NOTICE 15 capsule 0  . fexofenadine (ALLEGRA) 180 MG tablet Take 180 mg by mouth daily as needed.     Marland Kitchen  finasteride (PROSCAR) 5 MG tablet Take 5 mg by mouth daily.    Marland Kitchen gabapentin (NEURONTIN) 300 MG capsule Take 3 tabs po bid 540 capsule 0  . metFORMIN (GLUCOPHAGE) 500 MG tablet Take 1 tablet (500 mg total) by mouth 2 (two) times daily with a meal. 180 tablet 1  . oxybutynin (DITROPAN-XL) 5 MG 24 hr tablet TAKE 1 TABLET (5 MG TOTAL) BY MOUTH DAILY. 30 tablet 1  . sildenafil (REVATIO) 20 MG tablet Take 1-5 tablets orally as needed    . solifenacin (VESICARE) 5 MG tablet Take 2 tablets (10 mg total) by mouth  daily. 30 tablet 1  . tamsulosin (FLOMAX) 0.4 MG CAPS capsule Take 1 capsule (0.4 mg total) by mouth daily. 90 capsule 1  . triamcinolone ointment (KENALOG) 0.5 % Apply 1 application topically 2 (two) times daily as needed. 45 g 1  . fluticasone (FLONASE) 50 MCG/ACT nasal spray Place 2 sprays into both nostrils daily. 48 g 3   No current facility-administered medications on file prior to visit.      Past Medical History:  Diagnosis Date  . BACK PAIN    chronic  . COPD (chronic obstructive pulmonary disease) ([place])   . GERD   . Hyperlipidemia   . HYPERTENSION   . HYPERTROPHY PROSTATE W/UR OBST & OTH LUTS   . Nocturia   . Numbness of hand    since 2005  . OSTEOARTHRITIS   . PEPTIC ULCER DISEASE   . Personal history of colonic adenomas 09/30/2012  . Pneumonia   . Snoring disorder    Allergies  Allergen Reactions  . Asa [Aspirin]     Reacts as a sleeping pill    Social History   Social History  . Marital status: Married    Spouse name: Benjamin Conner  . Number of children: 2  . Years of education: 12   Occupational History  . Cashier     [employer]   Social History Main Topics  . Smoking status: Former Smoker    Packs/day: 1.00    Years: 36.00    Types: Cigarettes    Start date: 01/06/1971    Quit date: 01/06/2007  . Smokeless tobacco: Never Used     Comment: quit 8\2009  . Alcohol use 1.8 oz/week    3 Shots of liquor per week     Comment: 2-3 daily  . Drug use: No  . Sexual activity: Yes   Other Topics Concern  . None   Social History Narrative   Patient is married to [place]. Patient has a high school education. Patient is a Scientist, water quality for [employer].   Caffeine- four daily.   Left handed.    Vitals:   07/17/16 0927  BP: 130/70  Pulse: 85  Resp: 12   Body mass index is 30.03 kg/m.   Physical Exam  Nursing note and vitals reviewed. Constitutional: He is oriented to person, place, and time. He appears well-developed. No distress.  HENT:  Head:  Atraumatic.  Mouth/Throat: Oropharynx is clear and moist and mucous membranes are normal.  Eyes: Pupils are equal, round, and reactive to light. Conjunctivae and EOM are normal.  Neck: No tracheal deviation present. No thyroid mass and no thyromegaly present.  Cardiovascular: Normal rate and regular rhythm.   Murmur (SEM I/VI RUSB) heard. Pulses:      Dorsalis pedis pulses are 2+ on the right side, and 2+ on the left side.  Respiratory: Effort normal and breath sounds normal. No respiratory distress.  GI: Soft. He exhibits no mass. There is no hepatomegaly. There is no tenderness.  Musculoskeletal: He exhibits edema (Trace pitting LE edema, bilateral). He exhibits no tenderness.  Lymphadenopathy:    He has no cervical adenopathy.  Neurological: He is alert and oriented to person, place, and time. He has normal strength.  Skin: Skin is warm. No erythema.  Psychiatric: He has a normal mood and affect. Cognition and memory are normal.  Well groomed, good eye contact.   Diabetic foot exam:  Monofilament normal bilateral. Peripheral pulses present (DP). Small callus left hallux,medially. No hypertrophic/long toenails.    ASSESSMENT AND PLAN:   Mr. Trip was seen today for follow-up.  Diagnoses and all orders for this visit:  Lab Results  Component Value Date   HGBA1C 5.8 07/17/2016   Lab Results  Component Value Date   CHOL 131 07/17/2016   HDL 44.50 07/17/2016   LDLCALC 118 02/01/2015   LDLDIRECT 64.0 07/17/2016   TRIG 224.0 (H) 07/17/2016   CHOLHDL 3 07/17/2016     Chemistry      Component Value Date/Time   NA 139 07/17/2016 1006   NA 137 05/01/2016   K 3.8 07/17/2016 1006   CL 105 07/17/2016 1006   CO2 25 07/17/2016 1006   BUN 19 07/17/2016 1006   BUN 19 05/01/2016   CREATININE 0.94 07/17/2016 1006   CREATININE 0.90 03/17/2013 1718   GLU 116 05/01/2016      Component Value Date/Time   CALCIUM 10.9 (H) 07/17/2016 1006   ALKPHOS 72 07/17/2016 1006   AST 28  07/17/2016 1006   ALT 38 07/17/2016 1006   BILITOT 0.5 07/17/2016 1006     Lab Results  Component Value Date   MICROALBUR 0.9 07/17/2016     Diabetes mellitus type II, non insulin dependent ([place])  HgA1C pending. No changes in current management, will adjust Metformin according to lab result. Regular exercise and healthy diet with avoidance of added sugar food intake is an important part of treatment and recommended. Annual eye exam (due), periodic dental and foot care recommended. F/U in 5-6 months  -     Hemoglobin A1c -     Microalbumin / creatinine urine ratio -     Comprehensive metabolic panel  Chronic obstructive pulmonary disease, unspecified COPD type ([place])  Improved. He will continue current management. F/U in 6 months.  -     budesonide-formoterol (SYMBICORT) 80-4.5 MCG/ACT inhaler; Inhale 2 puffs into the lungs 2 (two) times daily. -     albuterol (VENTOLIN HFA) 108 (90 Base) MCG/ACT inhaler; Inhale 2 puffs into the lungs every 6 (six) hours as needed (cough, shortness of breath or wheezing).  Essential hypertension  Adequately controlled. No changes in current management. DASH-low salt diet recommended. Eye exam recommended annually. F/U in 6 months, before if needed.  -     Comprehensive metabolic panel -     valsartan-hydrochlorothiazide (DIOVAN-HCT) 160-25 MG tablet; Take 1 tablet by mouth daily.  Hyperlipidemia associated with type 2 diabetes mellitus (HCC)  No changes in current management, will follow labs done today and will give further recommendations accordingly. F/U in 6-12 months.  -     Lipid panel -     Comprehensive metabolic panel -     LDL cholesterol, direct -     atorvastatin (LIPITOR) 40 MG tablet; Take 1 tablet (40 mg total) by mouth daily.  Need for 23-polyvalent pneumococcal polysaccharide vaccine -     Pneumococcal polysaccharide vaccine 23-valent greater  than or equal to 2yo subcutaneous/IM    -Mr. Verdon Cummins Cupit  was advised to return sooner than planned today if new concerns arise.       [name]. Martinique, MD  [facility]. [place] office.

## 2016-07-17 ENCOUNTER — Ambulatory Visit (INDEPENDENT_AMBULATORY_CARE_PROVIDER_SITE_OTHER): Payer: 59 | Admitting: Family Medicine

## 2016-07-17 ENCOUNTER — Encounter: Payer: Self-pay | Admitting: Family Medicine

## 2016-07-17 VITALS — BP 130/70 | HR 85 | Resp 12 | Ht 75.0 in | Wt 240.2 lb

## 2016-07-17 DIAGNOSIS — E785 Hyperlipidemia, unspecified: Secondary | ICD-10-CM | POA: Diagnosis not present

## 2016-07-17 DIAGNOSIS — J449 Chronic obstructive pulmonary disease, unspecified: Secondary | ICD-10-CM | POA: Diagnosis not present

## 2016-07-17 DIAGNOSIS — E1169 Type 2 diabetes mellitus with other specified complication: Secondary | ICD-10-CM | POA: Diagnosis not present

## 2016-07-17 DIAGNOSIS — Z23 Encounter for immunization: Secondary | ICD-10-CM | POA: Diagnosis not present

## 2016-07-17 DIAGNOSIS — I10 Essential (primary) hypertension: Secondary | ICD-10-CM | POA: Diagnosis not present

## 2016-07-17 DIAGNOSIS — E119 Type 2 diabetes mellitus without complications: Secondary | ICD-10-CM

## 2016-07-17 LAB — COMPREHENSIVE METABOLIC PANEL
ALBUMIN: 4.2 g/dL (ref 3.5–5.2)
ALK PHOS: 72 U/L (ref 39–117)
ALT: 38 U/L (ref 0–53)
AST: 28 U/L (ref 0–37)
BILIRUBIN TOTAL: 0.5 mg/dL (ref 0.2–1.2)
BUN: 19 mg/dL (ref 6–23)
CALCIUM: 10.9 mg/dL — AB (ref 8.4–10.5)
CO2: 25 mEq/L (ref 19–32)
CREATININE: 0.94 mg/dL (ref 0.40–1.50)
Chloride: 105 mEq/L (ref 96–112)
GFR: 86.87 mL/min (ref >60.00)
Glucose, Bld: 86 mg/dL (ref 70–99)
Potassium: 3.8 mEq/L (ref 3.5–5.1)
SODIUM: 139 meq/L (ref 135–145)
TOTAL PROTEIN: 6.3 g/dL (ref 6.0–8.3)

## 2016-07-17 LAB — LIPID PANEL
CHOL/HDL RATIO: 3
CHOLESTEROL: 131 mg/dL (ref 0–200)
HDL: 44.5 mg/dL (ref >39.00)
NonHDL: 86.41
Triglycerides: 224 mg/dL — ABNORMAL HIGH (ref 0.0–149.0)
VLDL: 44.8 mg/dL — AB (ref 0.0–40.0)

## 2016-07-17 LAB — MICROALBUMIN / CREATININE URINE RATIO
Creatinine,U: 95 mg/dL
Microalb Creat Ratio: 0.9 mg/g (ref 0.0–30.0)
Microalb, Ur: 0.9 mg/dL (ref 0.0–1.9)

## 2016-07-17 LAB — HEMOGLOBIN A1C: Hgb A1c MFr Bld: 5.8 % (ref 4.6–6.5)

## 2016-07-17 LAB — LDL CHOLESTEROL, DIRECT: Direct LDL: 64 mg/dL

## 2016-07-17 MED ORDER — VALSARTAN-HYDROCHLOROTHIAZIDE 160-25 MG PO TABS: 1.0000 | ORAL_TABLET | Freq: Every day | ORAL | 2 refills | Status: DC

## 2016-07-17 NOTE — Patient Instructions (Signed)
A few things to remember from today's visit:   Chronic obstructive pulmonary disease, unspecified COPD type ([place])  Diabetes mellitus type II, non insulin dependent ([place]) - Plan: Hemoglobin A1c, Microalbumin / creatinine urine ratio, Comprehensive metabolic panel  Essential hypertension - Plan: Comprehensive metabolic panel, valsartan-hydrochlorothiazide (DIOVAN-HCT) 160-25 MG tablet  Hyperlipidemia associated with type 2 diabetes mellitus ([place]) - Plan: Lipid panel, Comprehensive metabolic panel  SMO7M goal < 7.0. Avoid sugar added food:regular soft drinks, energy drinks, and sports drinks. candy. cakes. cookies. pies and cobblers. sweet rolls, pastries, and donuts. fruit drinks, such as fruitades and fruit punch. dairy desserts, such as ice cream  Mediterranean diet has showed benefits for sugar control.  How much and what type of carbohydrate foods are important for managing diabetes. The balance between how much insulin is in your body and the carbohydrate you eat makes a difference in your blood glucose levels.  Fasting blood sugar ideally 130 or less, 2 hours after meals less than 180.   Regular exercise also will help with controlling disease, daily brisk walking as tolerated for 15-30 min definitively will help.   Avoid skipping meals, blood sugar might drop and cause serious problems. Remember checking feet periodically, good dental hygiene, and annual eye exam.     Please be sure medication list is accurate. If a new problem present, please set up appointment sooner than planned today.

## 2016-07-18 ENCOUNTER — Encounter: Payer: Self-pay | Admitting: Family Medicine

## 2016-07-18 MED ORDER — METFORMIN HCL ER 500 MG PO TB24: 500.0000 mg | ORAL_TABLET | Freq: Every day | ORAL | 2 refills | Status: DC

## 2016-07-18 MED ORDER — BUDESONIDE-FORMOTEROL FUMARATE 80-4.5 MCG/ACT IN AERO: 2.0000 | INHALATION_SPRAY | Freq: Two times a day (BID) | RESPIRATORY_TRACT | 1 refills | Status: DC

## 2016-07-18 MED ORDER — ALBUTEROL SULFATE HFA 108 (90 BASE) MCG/ACT IN AERS: 2.0000 | INHALATION_SPRAY | Freq: Four times a day (QID) | RESPIRATORY_TRACT | 1 refills | Status: DC | PRN

## 2016-07-18 MED ORDER — ATORVASTATIN CALCIUM 40 MG PO TABS: 40.0000 mg | ORAL_TABLET | Freq: Every day | ORAL | 3 refills | Status: DC

## 2016-09-07 ENCOUNTER — Other Ambulatory Visit: Payer: Self-pay | Admitting: Family Medicine

## 2016-09-07 DIAGNOSIS — I10 Essential (primary) hypertension: Secondary | ICD-10-CM

## 2016-09-12 ENCOUNTER — Other Ambulatory Visit: Payer: Self-pay | Admitting: Family Medicine

## 2016-09-24 ENCOUNTER — Encounter: Payer: Self-pay | Admitting: Family Medicine

## 2016-09-26 ENCOUNTER — Other Ambulatory Visit: Payer: Self-pay | Admitting: Family Medicine

## 2016-09-26 DIAGNOSIS — E1169 Type 2 diabetes mellitus with other specified complication: Secondary | ICD-10-CM

## 2016-09-26 DIAGNOSIS — E785 Hyperlipidemia, unspecified: Principal | ICD-10-CM

## 2016-10-14 ENCOUNTER — Other Ambulatory Visit: Payer: Self-pay

## 2016-10-14 MED ORDER — OXYBUTYNIN CHLORIDE ER 5 MG PO TB24: 5.0000 mg | ORAL_TABLET | Freq: Every day | ORAL | 0 refills | Status: DC

## 2016-10-22 ENCOUNTER — Encounter: Payer: Self-pay | Admitting: Family Medicine

## 2016-11-12 ENCOUNTER — Other Ambulatory Visit: Payer: Self-pay | Admitting: Family Medicine

## 2016-12-21 ENCOUNTER — Other Ambulatory Visit: Payer: Self-pay | Admitting: *Deleted

## 2016-12-21 DIAGNOSIS — E1169 Type 2 diabetes mellitus with other specified complication: Secondary | ICD-10-CM

## 2016-12-21 DIAGNOSIS — E785 Hyperlipidemia, unspecified: Principal | ICD-10-CM

## 2016-12-21 MED ORDER — ATORVASTATIN CALCIUM 40 MG PO TABS: 40.0000 mg | ORAL_TABLET | Freq: Every day | ORAL | 1 refills | Status: DC

## 2017-01-15 ENCOUNTER — Ambulatory Visit: Payer: 59 | Admitting: Family Medicine

## 2017-01-18 NOTE — Progress Notes (Signed)
HPI:   BenjaminBenjamin Conner is a 62 y.o. male, who is here today for 6 months follow up.   He was last seen on 07/17/2016  Hypertension: Diagnosed in 2010. He is currently on Valsartan- HCTZ 160-25 mg daily. Last eye exam: About 6 months ago.   Lab Results  Component Value Date   CREATININE 0.94 07/17/2016   BUN 19 07/17/2016   NA 139 07/17/2016   K 3.8 07/17/2016   CL 105 07/17/2016   CO2 25 07/17/2016     DM 2: Diagnosed in 2017. Currently he is on Metformin 500 mg twice daily.  He is not checking BS's,glucometer is not working.  Denies abdominal pain, nausea,vomiting, polydipsia,polyuria, or polyphagia.  Lab Results  Component Value Date   HGBA1C 5.8 07/17/2016    HLD: He is currently on Lipitor 40 mg daily. He is also following low fat diet.  Last lipid panel in 07/2016: TC 131, HDL 44, TG 224, and LDL 64.   Hypercalcemia: 07/17/2016 Ca++ was elevated at 10.9  He denies abdominal pain,nausea,or vomiting.  BPH: He follows with urology annually.  Ankles throbbing for about 1-2 months, intermittently, 2-3 times per week. 7/10, medial and lateral aspect.. No numbness or tingling. In the morning when he is still in bed and alleviated by walking a few steps.  He denies recent abx and no Hx of trauma.    Back pain: C/O of bilateral lower back pain. Sometimes radiated to RLE with numbness. No changes in urine continence and no stool incontinence. Hx of lower back pain with radiculopathy. According to pt, ortho Dx with DDD.  Problem is getting worse. Flexeril is not longer helping.  OTC Tylenol does not help. Aleve and Excedrin and Aleve x 2 daily.   Leg cramps,intermittently for about a month. He is taking OTC K+ and Mg daily, these have helped. He has not identified exacerbating or alleviating factors.    Review of Systems  Constitutional: Negative for activity change, appetite change, fatigue, fever and unexpected weight change.    HENT: Negative for nosebleeds, sore throat and trouble swallowing.   Eyes: Negative for redness and visual disturbance.  Respiratory: Negative for apnea, cough, shortness of breath and wheezing.   Cardiovascular: Negative for chest pain, palpitations and leg swelling.  Gastrointestinal: Negative for abdominal pain, nausea and vomiting.  Endocrine: Negative for cold intolerance, heat intolerance, polydipsia, polyphagia and polyuria.  Genitourinary: Negative for decreased urine volume, dysuria and hematuria.  Musculoskeletal: Positive for arthralgias, back pain and myalgias. Negative for gait problem.  Skin: Negative for rash and wound.  Neurological: Negative for dizziness, syncope, weakness and headaches.  Psychiatric/Behavioral: Negative for confusion. The patient is not nervous/anxious.      Current Outpatient Medications on File Prior to Visit  Medication Sig Dispense Refill  . albuterol (VENTOLIN HFA) 108 (90 Base) MCG/ACT inhaler Inhale 2 puffs into the lungs every 6 (six) hours as needed (cough, shortness of breath or wheezing). 18 g 1  . aspirin 81 MG tablet Take 81 mg by mouth daily.    Marland Kitchen atorvastatin (LIPITOR) 40 MG tablet Take 1 tablet (40 mg total) by mouth daily. 90 tablet 1  . budesonide-formoterol (SYMBICORT) 80-4.5 MCG/ACT inhaler Inhale 2 puffs into the lungs 2 (two) times daily. 3 Inhaler 1  . Cholecalciferol (VITAMIN D PO) Take by mouth.    . cyclobenzaprine (FLEXERIL) 5 MG tablet Take 2 tabs bid 90 tablet 1  . esomeprazole (NEXIUM) 40 MG capsule Take  1 capsule (40 mg total) by mouth daily. NO MORE REFILLS WITHOUT OFFICE VISIT - 2ND NOTICE 15 capsule 0  . fexofenadine (ALLEGRA) 180 MG tablet Take 180 mg by mouth daily as needed.     . finasteride (PROSCAR) 5 MG tablet Take 5 mg by mouth daily.    . metFORMIN (GLUCOPHAGE) 500 MG tablet TAKE 1 TABLET BY MOUTH TWICE A DAY WITH MEAL 180 tablet 1  . oxybutynin (DITROPAN-XL) 5 MG 24 hr tablet Take 1 tablet (5 mg total) by  mouth daily. 90 tablet 0  . sildenafil (REVATIO) 20 MG tablet Take 1-5 tablets orally as needed    . solifenacin (VESICARE) 5 MG tablet Take 2 tablets (10 mg total) by mouth daily. 30 tablet 1  . tamsulosin (FLOMAX) 0.4 MG CAPS capsule Take 1 capsule (0.4 mg total) by mouth daily. 90 capsule 1  . triamcinolone ointment (KENALOG) 0.5 % Apply 1 application topically 2 (two) times daily as needed. 45 g 1  . valsartan-hydrochlorothiazide (DIOVAN-HCT) 160-25 MG tablet Take 1 tablet by mouth daily. 90 tablet 2  . fluticasone (FLONASE) 50 MCG/ACT nasal spray Place 2 sprays into both nostrils daily. 48 g 3   No current facility-administered medications on file prior to visit.      Past Medical History:  Diagnosis Date  . BACK PAIN    chronic  . COPD (chronic obstructive pulmonary disease) ([place])   . GERD   . Hyperlipidemia   . HYPERTENSION   . HYPERTROPHY PROSTATE W/UR OBST & OTH LUTS   . Nocturia   . Numbness of hand    since 2005  . OSTEOARTHRITIS   . PEPTIC ULCER DISEASE   . Personal history of colonic adenomas 09/30/2012  . Pneumonia   . Snoring disorder    Allergies  Allergen Reactions  . Asa [Aspirin]     Reacts as a sleeping pill    Social History   Socioeconomic History  . Marital status: Married    Spouse name: Benjamin Conner  . Number of children: 2  . Years of education: 84  . Highest education level: None  Social Needs  . Financial resource strain: None  . Food insecurity - worry: None  . Food insecurity - inability: None  . Transportation needs - medical: None  . Transportation needs - non-medical: None  Occupational History  . Occupation: Scientist, water quality    Comment: [employer]  Tobacco Use  . Smoking status: Former Smoker    Packs/day: 1.00    Years: 36.00    Pack years: 36.00    Types: Cigarettes    Start date: 01/06/1971    Last attempt to quit: 01/06/2007    Years since quitting: 10.0  . Smokeless tobacco: Never Used  . Tobacco comment: quit 8\2009  Substance  and Sexual Activity  . Alcohol use: Yes    Alcohol/week: 1.8 oz    Types: 3 Shots of liquor per week    Comment: 2-3 daily  . Drug use: No  . Sexual activity: Yes  Other Topics Concern  . None  Social History Narrative   Patient is married to [place]. Patient has a high school education. Patient is a Scientist, water quality for [employer].   Caffeine- four daily.   Left handed.    Vitals:   01/19/17 0838  BP: 134/70  Pulse: 62  Resp: 12  Temp: 98.1 F (36.7 C)  SpO2: 98%   Body mass index is 30.19 kg/m.   Physical Exam  Nursing note and  vitals reviewed. Constitutional: He is oriented to person, place, and time. He appears well-developed. No distress.  HENT:  Head: Normocephalic and atraumatic.  Mouth/Throat: Oropharynx is clear and moist and mucous membranes are normal.  Eyes: Conjunctivae are normal. Pupils are equal, round, and reactive to light.  Cardiovascular: Normal rate and regular rhythm.  No murmur heard. Pulses:      Dorsalis pedis pulses are 2+ on the right side, and 2+ on the left side.  Respiratory: Effort normal and breath sounds normal. No respiratory distress.  GI: Soft. He exhibits no mass. There is no hepatomegaly. There is no tenderness.  Musculoskeletal: He exhibits no edema.       Right ankle: He exhibits normal range of motion. Tenderness. Lateral malleolus and medial malleolus tenderness found. Achilles tendon exhibits normal Thompson's test results.       Left ankle: He exhibits normal range of motion and no deformity. No tenderness.       Lumbar back: He exhibits tenderness. He exhibits no bony tenderness.       Back:  Right ankle tenderness upon palpation of medial and lateral peri malleolus.    Lymphadenopathy:    He has no cervical adenopathy.  Neurological: He is alert and oriented to person, place, and time. He has normal strength. Gait normal.  Reflex Scores:      Patellar reflexes are 2+ on the right side and 2+ on the left side. SLR negative  bilateral.  Skin: Skin is warm. No rash noted. No erythema.  Psychiatric: He has a normal mood and affect. Cognition and memory are normal.  Well groomed, good eye contact.   Diabetic Foot Exam - Simple   Simple Foot Form Diabetic Foot exam was performed with the following findings:  Yes 01/19/2017  9:08 AM  Visual Inspection No deformities, no ulcerations, no other skin breakdown bilaterally:  Yes Sensation Testing Pulse Check Posterior Tibialis and Dorsalis pulse intact bilaterally:  Yes Comments       ASSESSMENT AND PLAN:   Mr. Larone Kliethermes was seen today for 6 months follow-up.   Diagnoses and all orders for this visit:  Lab Results  Component Value Date   CHOL 162 01/19/2017   HDL 44.10 01/19/2017   LDLCALC 118 02/01/2015   LDLDIRECT 79.0 01/19/2017   TRIG (H) 01/19/2017    547.0 Triglyceride is over 400; calculations on Lipids are invalid.   CHOLHDL 4 01/19/2017   Lab Results  Component Value Date   CREATININE 0.93 01/19/2017   BUN 20 01/19/2017   NA 141 01/19/2017   K 3.9 01/19/2017   CL 103 01/19/2017   CO2 29 01/19/2017    Bilateral ankle pain, unspecified chronicity  No Hx of trauma,so I do not think imaging is needed today. OTC Aleve 220 mg bid as needed. ROM exercises (ABC) recommended. Topical icy Hot may also help. Side effects of NSAID's discussed.  Lumbar radicular pain  Chronic. We discussed options: Ortho evaluation,PT, and/or Cymbalta. He prefers to hold on referrals. Stop Flexeril. He would like to try Cymbalta,side effects discussed. F/U in 2 months.  -     DULoxetine (CYMBALTA) 30 MG capsule; Take 1 capsule (30 mg total) by mouth daily.  Diabetes mellitus type II, non insulin dependent ([place])  HgA1C at pending today. No changes in current management. Regular exercise and healthy diet with avoidance of added sugar food intake is an important part of treatment and recommended. Annual eye exam, periodic dental and foot  care recommended.  F/U in 5-6 months  -     Basic metabolic panel  Hyperlipidemia associated with type 2 diabetes mellitus (HCC)  No changes in current management, will follow labs done today and will give further recommendations accordingly.  -     Lipid panel  Essential hypertension  Adequately controlled. No changes in current management. DASH diet recommended. Eye exam recommended annually. F/U in 6 months, before if needed.  -     Basic metabolic panel  Hypercalcemia  Further recommendations will be given according to lab results.  -     Basic metabolic panel -     CBC with Differential/Platelet -     TSH -     Parathyroid hormone, intact (no Ca) -     Calcium, ionized           -Mr. Verdon Cummins Mauriello was advised to return sooner than planned today if new concerns arise.       [name]. Martinique, MD  [facility]. [place] office.

## 2017-01-19 ENCOUNTER — Encounter: Payer: Self-pay | Admitting: Family Medicine

## 2017-01-19 ENCOUNTER — Ambulatory Visit (INDEPENDENT_AMBULATORY_CARE_PROVIDER_SITE_OTHER): Payer: 59 | Admitting: Family Medicine

## 2017-01-19 VITALS — BP 134/70 | HR 62 | Temp 98.1°F | Resp 12 | Ht 75.0 in | Wt 241.5 lb

## 2017-01-19 DIAGNOSIS — E785 Hyperlipidemia, unspecified: Secondary | ICD-10-CM | POA: Diagnosis not present

## 2017-01-19 DIAGNOSIS — M25572 Pain in left ankle and joints of left foot: Secondary | ICD-10-CM

## 2017-01-19 DIAGNOSIS — M25571 Pain in right ankle and joints of right foot: Secondary | ICD-10-CM | POA: Diagnosis not present

## 2017-01-19 DIAGNOSIS — E1169 Type 2 diabetes mellitus with other specified complication: Secondary | ICD-10-CM

## 2017-01-19 DIAGNOSIS — E119 Type 2 diabetes mellitus without complications: Secondary | ICD-10-CM | POA: Diagnosis not present

## 2017-01-19 DIAGNOSIS — M5416 Radiculopathy, lumbar region: Secondary | ICD-10-CM

## 2017-01-19 DIAGNOSIS — I10 Essential (primary) hypertension: Secondary | ICD-10-CM | POA: Diagnosis not present

## 2017-01-19 LAB — CBC WITH DIFFERENTIAL/PLATELET
Basophils Absolute: 0 10*3/uL (ref 0.0–0.1)
Basophils Relative: 0.7 % (ref 0.0–3.0)
Eosinophils Absolute: 0.3 10*3/uL (ref 0.0–0.7)
Eosinophils Relative: 4.1 % (ref 0.0–5.0)
HCT: 43.7 % (ref 39.0–52.0)
HEMOGLOBIN: 14.9 g/dL (ref 13.0–17.0)
Lymphocytes Relative: 27.3 % (ref 12.0–46.0)
Lymphs Abs: 1.9 10*3/uL (ref 0.7–4.0)
MCHC: 34 g/dL (ref 30.0–36.0)
MCV: 91.5 fl (ref 78.0–100.0)
MONO ABS: 0.6 10*3/uL (ref 0.1–1.0)
Monocytes Relative: 8.8 % (ref 3.0–12.0)
Neutro Abs: 4.1 10*3/uL (ref 1.4–7.7)
Neutrophils Relative %: 59.1 % (ref 43.0–77.0)
Platelets: 204 10*3/uL (ref 150.0–400.0)
RBC: 4.77 Mil/uL (ref 4.22–5.81)
RDW: 12.9 % (ref 11.5–15.5)
WBC: 6.9 10*3/uL (ref 4.0–10.5)

## 2017-01-19 LAB — BASIC METABOLIC PANEL
BUN: 20 mg/dL (ref 6–23)
CO2: 29 mEq/L (ref 19–32)
CREATININE: 0.93 mg/dL (ref 0.40–1.50)
Calcium: 10.5 mg/dL (ref 8.4–10.5)
Chloride: 103 mEq/L (ref 96–112)
GFR: 87.8 mL/min (ref >60.00)
Glucose, Bld: 114 mg/dL — ABNORMAL HIGH (ref 70–99)
POTASSIUM: 3.9 meq/L (ref 3.5–5.1)
Sodium: 141 mEq/L (ref 135–145)

## 2017-01-19 LAB — TSH: TSH: 3.39 u[IU]/mL (ref 0.35–4.50)

## 2017-01-19 LAB — LDL CHOLESTEROL, DIRECT: LDL DIRECT: 79 mg/dL

## 2017-01-19 LAB — LIPID PANEL
Cholesterol: 162 mg/dL (ref 0–200)
HDL: 44.1 mg/dL (ref >39.00)
Total CHOL/HDL Ratio: 4

## 2017-01-19 MED ORDER — DULOXETINE HCL 30 MG PO CPEP: 30.0000 mg | ORAL_CAPSULE | Freq: Every day | ORAL | 1 refills | Status: DC

## 2017-01-19 NOTE — Patient Instructions (Signed)
A few things to remember from today's visit:   Bilateral ankle pain, unspecified chronicity  Lumbar radicular pain - Plan: DULoxetine (CYMBALTA) 30 MG capsule  Diabetes mellitus type II, non insulin dependent ([place]) - Plan: Basic metabolic panel  Hyperlipidemia associated with type 2 diabetes mellitus ([place]) - Plan: Lipid panel  Essential hypertension - Plan: Basic metabolic panel  Hypercalcemia - Plan: Basic metabolic panel, CBC with Differential/Platelet, TSH, Parathyroid hormone, intact (no Ca), Calcium, ionized  Cymbalta 30 mg started today. Stop Flexeril for now. Please call in 3-4 weeks ,so we can increase Cymbalta to 60 mg.  Let me know if you want to go to ortho again.  For ankle ABC PT as instructed.   Please be sure medication list is accurate. If a new problem present, please set up appointment sooner than planned today.

## 2017-01-20 LAB — PARATHYROID HORMONE, INTACT (NO CA): PTH: 51 pg/mL (ref 14–64)

## 2017-01-20 LAB — CALCIUM, IONIZED: CALCIUM ION: 5.9 mg/dL — AB (ref 4.8–5.6)

## 2017-01-28 ENCOUNTER — Encounter: Payer: Self-pay | Admitting: Family Medicine

## 2017-01-29 ENCOUNTER — Encounter: Payer: Self-pay | Admitting: Family Medicine

## 2017-02-03 ENCOUNTER — Encounter: Payer: Self-pay | Admitting: *Deleted

## 2017-02-13 ENCOUNTER — Other Ambulatory Visit: Payer: Self-pay | Admitting: Family Medicine

## 2017-02-23 ENCOUNTER — Other Ambulatory Visit: Payer: Self-pay | Admitting: *Deleted

## 2017-02-23 MED ORDER — OXYBUTYNIN CHLORIDE ER 5 MG PO TB24: 5.0000 mg | ORAL_TABLET | Freq: Every day | ORAL | 1 refills | Status: DC

## 2017-03-14 ENCOUNTER — Other Ambulatory Visit: Payer: Self-pay | Admitting: Family Medicine

## 2017-03-14 DIAGNOSIS — M5416 Radiculopathy, lumbar region: Secondary | ICD-10-CM

## 2017-03-17 ENCOUNTER — Encounter: Payer: Self-pay | Admitting: Family Medicine

## 2017-03-17 ENCOUNTER — Other Ambulatory Visit: Payer: Self-pay | Admitting: *Deleted

## 2017-03-17 DIAGNOSIS — E1169 Type 2 diabetes mellitus with other specified complication: Secondary | ICD-10-CM

## 2017-03-17 DIAGNOSIS — E785 Hyperlipidemia, unspecified: Principal | ICD-10-CM

## 2017-03-17 MED ORDER — ATORVASTATIN CALCIUM 40 MG PO TABS: ORAL_TABLET | ORAL | 1 refills | Status: DC

## 2017-03-28 ENCOUNTER — Other Ambulatory Visit: Payer: Self-pay | Admitting: Family Medicine

## 2017-03-28 DIAGNOSIS — I10 Essential (primary) hypertension: Secondary | ICD-10-CM

## 2017-04-06 ENCOUNTER — Encounter: Payer: Self-pay | Admitting: Family Medicine

## 2017-04-06 NOTE — Progress Notes (Signed)
HPI:   BenjaminJeovanni Khian Conner is a 62 y.o. male, who is here today to follow on recent OV.   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 Occasional RLE numbness. Cymbalta has helped with lower back pain, pain seems to be worse at the end of the day. Exacerbated by long working hours, recently increased from 8 to 10 hours. Achy/sharp pain,intermittent, 6-7/10.  Rash: Chronic on LE, pretibial and medial aspect of calves. Topical steroid has helped. He has not noted exacerbating or alleviating factors.   Hyperca++, last one 2 months ago normal high (10.5), it was 10.9 8 months ago.  He was evaluated by his urologist yesterday, PSA in normal range. 01/2017 PTH in normal range. Ica elevated at 5.9  HLD: Taking Lipitor 40 mg 2 tabs daily, dose increased in 01/2017. Lab Results  Component Value Date   CHOL 162 01/19/2017   HDL 44.10 01/19/2017   LDLCALC 118 02/01/2015   LDLDIRECT 79.0 01/19/2017   TRIG (H) 01/19/2017    547.0 Triglyceride is over 400; calculations on Lipids are invalid.   CHOLHDL 4 01/19/2017     Review of Systems  Constitutional: Negative for chills, fatigue and fever.  Respiratory: Positive for cough ("every now and then"). Negative for shortness of breath and wheezing.   Cardiovascular: Negative for chest pain and leg swelling.  Gastrointestinal: Negative for abdominal pain, nausea and vomiting.  Endocrine: Negative for cold intolerance and heat intolerance.  Genitourinary: Negative for decreased urine volume, dysuria and hematuria.  Musculoskeletal: Positive for arthralgias and back pain.  Skin: Positive for rash. Negative for wound.  Neurological: Negative for syncope, weakness and headaches.  Hematological: Negative for adenopathy. Does not bruise/bleed easily.      Current Outpatient Medications on File Prior to Visit  Medication Sig Dispense Refill  . aspirin  81 MG tablet Take 81 mg by mouth daily.    . budesonide-formoterol (SYMBICORT) 80-4.5 MCG/ACT inhaler Inhale 2 puffs into the lungs 2 (two) times daily. 3 Inhaler 1  . Cholecalciferol (VITAMIN D PO) Take by mouth.    . cyclobenzaprine (FLEXERIL) 5 MG tablet Take 2 tabs bid 90 tablet 1  . esomeprazole (NEXIUM) 40 MG capsule Take 1 capsule (40 mg total) by mouth daily. NO MORE REFILLS WITHOUT OFFICE VISIT - 2ND NOTICE 15 capsule 0  . fexofenadine (ALLEGRA) 180 MG tablet Take 180 mg by mouth daily as needed.     . finasteride (PROSCAR) 5 MG tablet Take 5 mg by mouth daily.    . metFORMIN (GLUCOPHAGE) 500 MG tablet TAKE 1 TABLET BY MOUTH TWICE A DAY WITH MEAL 180 tablet 1  . oxybutynin (DITROPAN-XL) 5 MG 24 hr tablet Take 1 tablet (5 mg total) by mouth daily. 90 tablet 1  . sildenafil (REVATIO) 20 MG tablet Take 1-5 tablets orally as needed    . solifenacin (VESICARE) 5 MG tablet Take 2 tablets (10 mg total) by mouth daily. 30 tablet 1  . tamsulosin (FLOMAX) 0.4 MG CAPS capsule Take 1 capsule (0.4 mg total) by mouth daily. 90 capsule 1  . valsartan-hydrochlorothiazide (DIOVAN-HCT) 160-25 MG tablet TAKE 1 TABLET BY MOUTH DAILY. 90 tablet 1  . albuterol (VENTOLIN HFA) 108 (90 Base) MCG/ACT inhaler Inhale 2 puffs into the lungs every 6 (six) hours as needed (cough, shortness of breath or wheezing). (Patient not taking: Reported on 04/07/2017) 18 g 1  . fluticasone (  FLONASE) 50 MCG/ACT nasal spray Place 2 sprays into both nostrils daily. 48 g 3  . valsartan-hydrochlorothiazide (DIOVAN-HCT) 160-25 MG tablet Take 1 tablet by mouth daily. 90 tablet 2   No current facility-administered medications on file prior to visit.      Past Medical History:  Diagnosis Date  . BACK PAIN    chronic  . COPD (chronic obstructive pulmonary disease) ([place])   . GERD   . Hyperlipidemia   . HYPERTENSION   . HYPERTROPHY PROSTATE W/UR OBST & OTH LUTS   . Nocturia   . Numbness of hand    since 2005  . OSTEOARTHRITIS     . PEPTIC ULCER DISEASE   . Personal history of colonic adenomas 09/30/2012  . Pneumonia   . Snoring disorder    Allergies  Allergen Reactions  . Asa [Aspirin]     Reacts as a sleeping pill    Social History   Socioeconomic History  . Marital status: Married    Spouse name: Benjamin Conner  . Number of children: 2  . Years of education: 31  . Highest education level: Not on file  Occupational History  . Occupation: Scientist, water quality    Comment: Aeronautical engineer  Social Needs  . Financial resource strain: Not on file  . Food insecurity:    Worry: Not on file    Inability: Not on file  . Transportation needs:    Medical: Not on file    Non-medical: Not on file  Tobacco Use  . Smoking status: Former Smoker    Packs/day: 1.00    Years: 36.00    Pack years: 36.00    Types: Cigarettes    Start date: 01/06/1971    Last attempt to quit: 01/06/2007    Years since quitting: 10.2  . Smokeless tobacco: Never Used  . Tobacco comment: quit 8\2009  Substance and Sexual Activity  . Alcohol use: Yes    Alcohol/week: 1.8 oz    Types: 3 Shots of liquor per week    Comment: 2-3 daily  . Drug use: No  . Sexual activity: Yes  Lifestyle  . Physical activity:    Days per week: Not on file    Minutes per session: Not on file  . Stress: Not on file  Relationships  . Social connections:    Talks on phone: Not on file    Gets together: Not on file    Attends religious service: Not on file    Active member of club or organization: Not on file    Attends meetings of clubs or organizations: Not on file    Relationship status: Not on file  Other Topics Concern  . Not on file  Social History Narrative   Patient is married to [place]. Patient has a high school education. Patient is a Scientist, water quality for [employer].   Caffeine- four daily.   Left handed.    Vitals:   04/07/17 1407  BP: 130/78  Pulse: 62  Resp: 12  Temp: 98.1 F (36.7 C)  SpO2: 98%   Body mass index is 28.94 kg/m.    Physical Exam   Nursing note and vitals reviewed. Constitutional: He is oriented to person, place, and time. He appears well-developed. No distress.  HENT:  Head: Normocephalic and atraumatic.  Mouth/Throat: Oropharynx is clear and moist and mucous membranes are normal.  Eyes: Conjunctivae and EOM are normal.  Cardiovascular: Normal rate and regular rhythm.  No murmur heard. Respiratory: Effort normal and breath sounds normal. No  respiratory distress.  GI: Soft. He exhibits no mass. There is no hepatomegaly. There is no tenderness.  Musculoskeletal: He exhibits no edema.       Lumbar back: He exhibits tenderness.       Back:  No significant deformity appreciated. There is tenderness upon palpation of paraspinal lumbar muscles,bilateral. Small nodular lesions palpated on lumbar area,bilateral.Mobile, defined borders,deep.Left one was tender. No skin changes. Pain elicited with movement on exam table during examination.    Lymphadenopathy:    He has no cervical adenopathy.  Neurological: He is alert and oriented to person, place, and time. He has normal strength. Gait normal.  SLR negative.  Skin: Skin is warm. No rash noted. No erythema.  Psychiatric: He has a normal mood and affect.  Well groomed,good eye contact.    ASSESSMENT AND PLAN:  Mr. Ianmichael was seen today for follow-up.  Orders Placed This Encounter  Procedures  . DG Lumbar Spine Complete    Lumbar radicular pain She noticed some improvement with Cymbalta 30 mg. Since he tolerated medication well Cymbalta increased to 60 mg. He can also take OTC Aleve 220 mg twice daily, we discussed some side effects of chronic use of NSAIDs. Plain lumbar imaging ordered today. Follow-up in 4 months.  Hyperlipidemia associated with type 2 diabetes mellitus (HCC) Atorvastatin 80 mg tablet sent to his pharmacy. Continue low-fat diet. Follow-up in 4 months.  Hypercalcemia We discussed possible etiologies. He had urologic evaluation  yesterday and according to patient, lab work was fine. Lumbar x-ray ordered today. We will plan on repeating labs next visit.   Pruritic erythematous rash Topical steroid helps. No changes in current management. Follow-up as needed.      Tanay Misuraca G. Martinique, MD  [facility]. [place] office.

## 2017-04-07 ENCOUNTER — Ambulatory Visit: Payer: 59 | Admitting: Family Medicine

## 2017-04-07 ENCOUNTER — Encounter: Payer: Self-pay | Admitting: Family Medicine

## 2017-04-07 VITALS — BP 130/78 | HR 62 | Temp 98.1°F | Resp 12 | Ht 75.0 in | Wt 231.5 lb

## 2017-04-07 DIAGNOSIS — M5416 Radiculopathy, lumbar region: Secondary | ICD-10-CM | POA: Diagnosis not present

## 2017-04-07 DIAGNOSIS — E785 Hyperlipidemia, unspecified: Secondary | ICD-10-CM

## 2017-04-07 DIAGNOSIS — L298 Other pruritus: Secondary | ICD-10-CM | POA: Diagnosis not present

## 2017-04-07 DIAGNOSIS — E1169 Type 2 diabetes mellitus with other specified complication: Secondary | ICD-10-CM

## 2017-04-07 MED ORDER — DULOXETINE HCL 60 MG PO CPEP: 60.0000 mg | ORAL_CAPSULE | Freq: Every day | ORAL | 2 refills | Status: DC

## 2017-04-07 MED ORDER — TRIAMCINOLONE ACETONIDE 0.5 % EX OINT: 1.0000 "application " | TOPICAL_OINTMENT | Freq: Two times a day (BID) | CUTANEOUS | 1 refills | Status: DC | PRN

## 2017-04-07 MED ORDER — ATORVASTATIN CALCIUM 80 MG PO TABS
80.0000 mg | ORAL_TABLET | Freq: Every day | ORAL | 3 refills | Status: DC
Start: 2017-04-07 — End: 2018-05-11

## 2017-04-07 NOTE — Assessment & Plan Note (Signed)
Atorvastatin 80 mg tablet sent to his pharmacy. Continue low-fat diet. Follow-up in 4 months.

## 2017-04-07 NOTE — Assessment & Plan Note (Signed)
Topical steroid helps. No changes in current management. Follow-up as needed.

## 2017-04-07 NOTE — Patient Instructions (Addendum)
A few things to remember from today's visit:   Lumbar radicular pain - Plan: DULoxetine (CYMBALTA) 60 MG capsule, DG Lumbar Spine Complete  Pruritic erythematous rash - Plan: triamcinolone ointment (KENALOG) 0.5 %  Hyperlipidemia associated with type 2 diabetes mellitus ([place]) - Plan: atorvastatin (LIPITOR) 80 MG tablet  Hypercalcemia  Cymbalta increased to 60 mg. Lipitor increased to 80 mg tablet.   Please be sure medication list is accurate. If a new problem present, please set up appointment sooner than planned today.

## 2017-04-07 NOTE — Assessment & Plan Note (Signed)
She noticed some improvement with Cymbalta 30 mg. Since he tolerated medication well Cymbalta increased to 60 mg. He can also take OTC Aleve 220 mg twice daily, we discussed some side effects of chronic use of NSAIDs. Plain lumbar imaging ordered today. Follow-up in 4 months.

## 2017-04-07 NOTE — Assessment & Plan Note (Signed)
We discussed possible etiologies. He had urologic evaluation yesterday and according to patient, lab work was fine. Lumbar x-ray ordered today. We will plan on repeating labs next visit.

## 2017-04-08 ENCOUNTER — Encounter: Payer: Self-pay | Admitting: Family Medicine

## 2017-04-14 ENCOUNTER — Encounter: Payer: Self-pay | Admitting: Family Medicine

## 2017-04-14 ENCOUNTER — Ambulatory Visit (INDEPENDENT_AMBULATORY_CARE_PROVIDER_SITE_OTHER)
Admission: RE | Admit: 2017-04-14 | Discharge: 2017-04-14 | Disposition: A | Discharge status: Home / Self Care | Payer: 59 | Source: Ambulatory Visit | Attending: Family Medicine | Admitting: Family Medicine

## 2017-04-14 DIAGNOSIS — M5416 Radiculopathy, lumbar region: Secondary | ICD-10-CM

## 2017-04-16 ENCOUNTER — Encounter: Payer: Self-pay | Admitting: Family Medicine

## 2017-06-07 ENCOUNTER — Other Ambulatory Visit: Payer: Self-pay | Admitting: Family Medicine

## 2017-06-07 DIAGNOSIS — M5416 Radiculopathy, lumbar region: Secondary | ICD-10-CM

## 2017-08-11 ENCOUNTER — Ambulatory Visit: Payer: Self-pay | Admitting: Family Medicine

## 2017-08-11 ENCOUNTER — Encounter: Payer: Self-pay | Admitting: Family Medicine

## 2017-08-11 VITALS — BP 133/84 | HR 103 | Temp 98.1°F | Resp 12 | Ht 75.0 in | Wt 229.0 lb

## 2017-08-11 DIAGNOSIS — E119 Type 2 diabetes mellitus without complications: Secondary | ICD-10-CM | POA: Diagnosis not present

## 2017-08-11 DIAGNOSIS — I10 Essential (primary) hypertension: Secondary | ICD-10-CM

## 2017-08-11 DIAGNOSIS — E785 Hyperlipidemia, unspecified: Secondary | ICD-10-CM

## 2017-08-11 DIAGNOSIS — M5416 Radiculopathy, lumbar region: Secondary | ICD-10-CM | POA: Diagnosis not present

## 2017-08-11 DIAGNOSIS — E1169 Type 2 diabetes mellitus with other specified complication: Secondary | ICD-10-CM | POA: Diagnosis not present

## 2017-08-11 LAB — COMPREHENSIVE METABOLIC PANEL
ALBUMIN: 4.5 g/dL (ref 3.5–5.2)
ALT: 34 U/L (ref 0–53)
AST: 23 U/L (ref 0–37)
Alkaline Phosphatase: 98 U/L (ref 39–117)
BILIRUBIN TOTAL: 0.6 mg/dL (ref 0.2–1.2)
BUN: 17 mg/dL (ref 6–23)
CALCIUM: 10.7 mg/dL — AB (ref 8.4–10.5)
CO2: 28 meq/L (ref 19–32)
CREATININE: 0.96 mg/dL (ref 0.40–1.50)
Chloride: 104 mEq/L (ref 96–112)
GFR: 84.49 mL/min (ref >60.00)
Glucose, Bld: 154 mg/dL — ABNORMAL HIGH (ref 70–99)
Potassium: 4.4 mEq/L (ref 3.5–5.1)
Sodium: 139 mEq/L (ref 135–145)
Total Protein: 7 g/dL (ref 6.0–8.3)

## 2017-08-11 LAB — LIPID PANEL
CHOL/HDL RATIO: 3
Cholesterol: 168 mg/dL (ref 0–200)
HDL: 48.8 mg/dL (ref >39.00)
LDL CALC: 80 mg/dL (ref 0–99)
NonHDL: 119.27
TRIGLYCERIDES: 195 mg/dL — AB (ref 0.0–149.0)
VLDL: 39 mg/dL (ref 0.0–40.0)

## 2017-08-11 LAB — POCT GLYCOSYLATED HEMOGLOBIN (HGB A1C): Hemoglobin A1C: 5.9 % — AB (ref 4.0–5.6)

## 2017-08-11 NOTE — Progress Notes (Addendum)
HPI:   Benjamin Conner is a 62 y.o. male, who is here today for 4 months follow up.   Benjamin Conner was last seen on 04/07/17.  Lower back pain with radiation, chronic.  Benjamin Conner is on Cymbalta 60 mg,started in 01/2017. Benjamin Conner has tolerated medication well,no side effects reported. Pain has improved,now 4-5/10, it was 7-8/10. Exacerbated by long work hours, got worse when Benjamin Conner increased hours at work from 33 to 73. Pain is alleviated by rest.   HyperCa++:  Benjamin Conner Ca++ has been in normal high range for several months. Last Ca++ 10.5 (10.9).  ICa++ elevated at 5.9. PTH wnl.   Diabetes Mellitus II:   Currently on Metformin 500 mg bid.  Checking BS's : Not checking. Hypoglycemia: Negative.  Benjamin Conner is tolerating medications well. Benjamin Conner denies abdominal pain, nausea, vomiting, polydipsia, polyuria, or polyphagia. Negativenumbness, tingling, or burning.   Lab Results  Component Value Date   HGBA1C 5.8 07/17/2016   Lab Results  Component Value Date   MICROALBUR 0.9 07/17/2016    Hypertension:   Currently on Diovan HCT 160-25 mg daily.  Benjamin Conner is not checking BP. Last eye exam within the past year,02/2017.  Benjamin Conner is taking medications as instructed, no side effects reported.  Benjamin Conner has not noted unusual headache, visual changes, exertional chest pain, dyspnea,  focal weakness, or edema.   Lab Results  Component Value Date   CREATININE 0.93 01/19/2017   BUN 20 01/19/2017   NA 141 01/19/2017   K 3.9 01/19/2017   CL 103 01/19/2017   CO2 29 01/19/2017     Hyperlipidemia:  Currently on Lipitor 80 mg daily. Following a low fat diet: Yes..  Benjamin Conner has not noted side effects with medication.  Lab Results  Component Value Date   CHOL 162 01/19/2017   HDL 44.10 01/19/2017   LDLCALC 118 02/01/2015   LDLDIRECT 79.0 01/19/2017   TRIG (H) 01/19/2017    547.0 Triglyceride is over 400; calculations on Lipids are invalid.   CHOLHDL 4 01/19/2017    Review of Systems  Constitutional:  Negative for activity change, appetite change, fatigue and fever.  HENT: Negative for nosebleeds, sore throat and trouble swallowing.   Eyes: Negative for redness and visual disturbance.  Respiratory: Negative for apnea, cough, shortness of breath and wheezing.   Cardiovascular: Negative for chest pain, palpitations and leg swelling.  Gastrointestinal: Negative for abdominal pain, nausea and vomiting.  Endocrine: Negative for polydipsia, polyphagia and polyuria.  Genitourinary: Negative for decreased urine volume, dysuria and hematuria.  Musculoskeletal: Positive for back pain. Negative for gait problem.  Skin: Negative for rash and wound.  Neurological: Negative for syncope, weakness, numbness and headaches.     Current Outpatient Medications on File Prior to Visit  Medication Sig Dispense Refill  . albuterol (VENTOLIN HFA) 108 (90 Base) MCG/ACT inhaler Inhale 2 puffs into the lungs every 6 (six) hours as needed (cough, shortness of breath or wheezing). 18 g 1  . aspirin 81 MG tablet Take 81 mg by mouth daily.    Marland Kitchen atorvastatin (LIPITOR) 80 MG tablet Take 1 tablet (80 mg total) by mouth daily. 90 tablet 3  . budesonide-formoterol (SYMBICORT) 80-4.5 MCG/ACT inhaler Inhale 2 puffs into the lungs 2 (two) times daily. 3 Inhaler 1  . Cholecalciferol (VITAMIN D PO) Take by mouth.    . cyclobenzaprine (FLEXERIL) 5 MG tablet Take 2 tabs bid 90 tablet 1  . DULoxetine (CYMBALTA) 60 MG capsule Take 1 capsule (60 mg total) by  mouth daily. 90 capsule 2  . esomeprazole (NEXIUM) 40 MG capsule Take 1 capsule (40 mg total) by mouth daily. NO MORE REFILLS WITHOUT OFFICE VISIT - 2ND NOTICE 15 capsule 0  . fexofenadine (ALLEGRA) 180 MG tablet Take 180 mg by mouth daily as needed.     . finasteride (PROSCAR) 5 MG tablet Take 5 mg by mouth daily.    Marland Kitchen oxybutynin (DITROPAN-XL) 5 MG 24 hr tablet Take 1 tablet (5 mg total) by mouth daily. 90 tablet 1  . sildenafil (REVATIO) 20 MG tablet Take 1-5 tablets orally as  needed    . solifenacin (VESICARE) 5 MG tablet Take 2 tablets (10 mg total) by mouth daily. 30 tablet 1  . tamsulosin (FLOMAX) 0.4 MG CAPS capsule Take 1 capsule (0.4 mg total) by mouth daily. 90 capsule 1  . triamcinolone ointment (KENALOG) 0.5 % Apply 1 application topically 2 (two) times daily as needed. 45 g 1  . valsartan-hydrochlorothiazide (DIOVAN-HCT) 160-25 MG tablet Take 1 tablet by mouth daily. 90 tablet 2  . fluticasone (FLONASE) 50 MCG/ACT nasal spray Place 2 sprays into both nostrils daily. 48 g 3   No current facility-administered medications on file prior to visit.      Past Medical History:  Diagnosis Date  . BACK PAIN    chronic  . COPD (chronic obstructive pulmonary disease) ([place])   . GERD   . Hyperlipidemia   . HYPERTENSION   . HYPERTROPHY PROSTATE W/UR OBST & OTH LUTS   . Nocturia   . Numbness of hand    since 2005  . OSTEOARTHRITIS   . PEPTIC ULCER DISEASE   . Personal history of colonic adenomas 09/30/2012  . Pneumonia   . Snoring disorder    Allergies  Allergen Reactions  . Asa [Aspirin]     Reacts as a sleeping pill    Social History   Socioeconomic History  . Marital status: Married    Spouse name: [name]  . Number of children: 2  . Years of education: 94  . Highest education level: Not on file  Occupational History  . Occupation: Scientist, water quality    Comment: Aeronautical engineer  Social Needs  . Financial resource strain: Not on file  . Food insecurity:    Worry: Not on file    Inability: Not on file  . Transportation needs:    Medical: Not on file    Non-medical: Not on file  Tobacco Use  . Smoking status: Former Smoker    Packs/day: 1.00    Years: 36.00    Pack years: 36.00    Types: Cigarettes    Start date: 01/06/1971    Last attempt to quit: 01/06/2007    Years since quitting: 10.6  . Smokeless tobacco: Never Used  . Tobacco comment: quit 8\2009  Substance and Sexual Activity  . Alcohol use: Yes    Alcohol/week: 3.0 standard drinks     Types: 3 Shots of liquor per week    Comment: 2-3 daily  . Drug use: No  . Sexual activity: Yes  Lifestyle  . Physical activity:    Days per week: Not on file    Minutes per session: Not on file  . Stress: Not on file  Relationships  . Social connections:    Talks on phone: Not on file    Gets together: Not on file    Attends religious service: Not on file    Active member of club or organization: Not on file  Attends meetings of clubs or organizations: Not on file    Relationship status: Not on file  Other Topics Concern  . Not on file  Social History Narrative   Patient is married to Benjamin Conner. Patient has a high school education. Patient is a Scientist, water quality for [employer].   Caffeine- four daily.   Left handed.    Vitals:   08/11/17 1354  BP: 133/84  Pulse: (!) 103  Resp: 12  Temp: 98.1 F (36.7 C)  SpO2: 98%   Body mass index is 28.62 kg/m.   Physical Exam  Nursing note reviewed. Constitutional: Benjamin Conner is oriented to person, place, and time. Benjamin Conner appears well-developed and well-nourished. No distress.  HENT:  Head: Normocephalic and atraumatic.  Mouth/Throat: Oropharynx is clear and moist and mucous membranes are normal.  Eyes: Pupils are equal, round, and reactive to light. Conjunctivae are normal.  Cardiovascular: Normal rate and regular rhythm.  Murmur (soft SEM RUSB) heard. Pulses:      Dorsalis pedis pulses are 2+ on the right side, and 2+ on the left side.  HR by my count 92/min.  Respiratory: Effort normal and breath sounds normal. No respiratory distress.  GI: Soft. Benjamin Conner exhibits no mass. There is no hepatomegaly. There is no tenderness.  Musculoskeletal: Benjamin Conner exhibits no edema.       Lumbar back: Benjamin Conner exhibits no tenderness and no bony tenderness.  Lymphadenopathy:    Benjamin Conner has no cervical adenopathy.  Neurological: Benjamin Conner is alert and oriented to person, place, and time. Benjamin Conner has normal strength. No cranial nerve deficit. Gait normal.  Skin: Skin is warm. No rash noted.  No erythema.  Psychiatric: Benjamin Conner has a normal mood and affect.  Well groomed, good eye contact.      ASSESSMENT AND PLAN:   Mr. Manraj Yeo was seen today for 4 months follow-up.  Orders Placed This Encounter  Procedures  . Lipid panel  . Comprehensive metabolic panel  . POCT glycosylated hemoglobin (Hb A1C)   Lab Results  Component Value Date   HGBA1C 5.9 (A) 08/11/2017   Lab Results  Component Value Date   ALT 34 08/11/2017   AST 23 08/11/2017   ALKPHOS 98 08/11/2017   BILITOT 0.6 08/11/2017   Lab Results  Component Value Date   CREATININE 0.96 08/11/2017   BUN 17 08/11/2017   NA 139 08/11/2017   K 4.4 08/11/2017   CL 104 08/11/2017   CO2 28 08/11/2017   Lab Results  Component Value Date   CHOL 168 08/11/2017   HDL 48.80 08/11/2017   LDLCALC 80 08/11/2017   LDLDIRECT 79.0 01/19/2017   TRIG 195.0 (H) 08/11/2017   CHOLHDL 3 08/11/2017    1. Lumbar radicular pain  Improved with Cymbalta 60 mg. No changes in current management. Some side effects discussed.  2. Hypercalcemia  We discussed possible etiologies. Benjamin Conner is following with urologist for BPH and reporting normal rectal exam and PSA.  We will continue following.  - Comprehensive metabolic panel  3. Diabetes mellitus type II, non insulin dependent (HCC)  It has been well controlled for a year. Benjamin Conner can try nin pharmacologic treatment,Benjamin Conner agrees with stopping Metformin. Benjamin Conner needs to monitor BS at home. Continue foot care and healthy diet. F/U in 4-5 months.  - POCT glycosylated hemoglobin (Hb A1C) - Comprehensive metabolic panel  4. Hyperlipidemia associated with type 2 diabetes mellitus (HCC)  No changes in current management, will follow labs done today and will give further recommendations accordingly.  - Lipid  panel - Comprehensive metabolic panel    [name]. Martinique, MD  [facility]. [place] office.

## 2017-08-11 NOTE — Patient Instructions (Signed)
A few things to remember from today's visit:   Lumbar radicular pain  Hypercalcemia - Plan: Comprehensive metabolic panel  Diabetes mellitus type II, non insulin dependent ([place]) - Plan: POCT glycosylated hemoglobin (Hb A1C), Comprehensive metabolic panel  Hyperlipidemia associated with type 2 diabetes mellitus ([place]) - Plan: Lipid panel, Comprehensive metabolic panel  Will stop Metformin and try non pharmacologic treatment.   Please be sure medication list is accurate. If a new problem present, please set up appointment sooner than planned today.

## 2017-08-15 ENCOUNTER — Encounter: Payer: Self-pay | Admitting: Family Medicine

## 2017-08-15 MED ORDER — VALSARTAN-HYDROCHLOROTHIAZIDE 160-25 MG PO TABS: 1.0000 | ORAL_TABLET | Freq: Every day | ORAL | 2 refills | Status: DC

## 2017-10-29 ENCOUNTER — Other Ambulatory Visit: Payer: Self-pay | Admitting: Family Medicine

## 2018-01-10 ENCOUNTER — Encounter: Payer: Self-pay | Admitting: Internal Medicine

## 2018-02-14 ENCOUNTER — Ambulatory Visit: Payer: BLUE CROSS/BLUE SHIELD | Admitting: Family Medicine

## 2018-03-10 ENCOUNTER — Encounter: Payer: Self-pay | Admitting: Family Medicine

## 2018-05-07 ENCOUNTER — Other Ambulatory Visit: Payer: Self-pay | Admitting: Family Medicine

## 2018-05-07 DIAGNOSIS — L298 Other pruritus: Secondary | ICD-10-CM

## 2018-05-11 ENCOUNTER — Other Ambulatory Visit: Payer: Self-pay | Admitting: Family Medicine

## 2018-05-11 DIAGNOSIS — E785 Hyperlipidemia, unspecified: Principal | ICD-10-CM

## 2018-05-11 DIAGNOSIS — E1169 Type 2 diabetes mellitus with other specified complication: Secondary | ICD-10-CM

## 2018-06-23 ENCOUNTER — Other Ambulatory Visit: Payer: Self-pay | Admitting: Family Medicine

## 2018-06-23 DIAGNOSIS — M5416 Radiculopathy, lumbar region: Secondary | ICD-10-CM

## 2018-06-28 NOTE — Telephone Encounter (Signed)
Can you please schedule f/u appt. Last seen 08/2017. Thanks, BJ

## 2018-07-25 ENCOUNTER — Other Ambulatory Visit: Payer: Self-pay | Admitting: Family Medicine

## 2018-08-01 ENCOUNTER — Encounter: Payer: Self-pay | Admitting: Family Medicine

## 2018-08-02 ENCOUNTER — Encounter: Payer: Self-pay | Admitting: Family Medicine

## 2018-08-03 DIAGNOSIS — Z1159 Encounter for screening for other viral diseases: Secondary | ICD-10-CM | POA: Diagnosis not present

## 2018-08-03 DIAGNOSIS — R0600 Dyspnea, unspecified: Secondary | ICD-10-CM | POA: Diagnosis not present

## 2018-08-03 DIAGNOSIS — R05 Cough: Secondary | ICD-10-CM | POA: Diagnosis not present

## 2018-08-13 ENCOUNTER — Other Ambulatory Visit: Payer: Self-pay | Admitting: Family Medicine

## 2018-08-13 DIAGNOSIS — M5416 Radiculopathy, lumbar region: Secondary | ICD-10-CM

## 2018-09-30 ENCOUNTER — Other Ambulatory Visit: Payer: Self-pay | Admitting: Family Medicine

## 2018-09-30 DIAGNOSIS — M5416 Radiculopathy, lumbar region: Secondary | ICD-10-CM

## 2018-10-08 ENCOUNTER — Other Ambulatory Visit: Payer: Self-pay | Admitting: Family Medicine

## 2018-10-08 DIAGNOSIS — M5416 Radiculopathy, lumbar region: Secondary | ICD-10-CM

## 2018-10-26 ENCOUNTER — Encounter: Payer: Self-pay | Admitting: Family Medicine

## 2018-10-26 ENCOUNTER — Ambulatory Visit (INDEPENDENT_AMBULATORY_CARE_PROVIDER_SITE_OTHER): Payer: BC Managed Care – PPO | Admitting: Family Medicine

## 2018-10-26 ENCOUNTER — Other Ambulatory Visit: Payer: Self-pay

## 2018-10-26 VITALS — BP 110/60 | HR 72 | Temp 97.2°F | Resp 16 | Ht 75.0 in | Wt 233.2 lb

## 2018-10-26 DIAGNOSIS — M5416 Radiculopathy, lumbar region: Secondary | ICD-10-CM | POA: Diagnosis not present

## 2018-10-26 DIAGNOSIS — E785 Hyperlipidemia, unspecified: Secondary | ICD-10-CM

## 2018-10-26 DIAGNOSIS — I10 Essential (primary) hypertension: Secondary | ICD-10-CM

## 2018-10-26 DIAGNOSIS — Z23 Encounter for immunization: Secondary | ICD-10-CM

## 2018-10-26 DIAGNOSIS — E119 Type 2 diabetes mellitus without complications: Secondary | ICD-10-CM | POA: Diagnosis not present

## 2018-10-26 DIAGNOSIS — E1169 Type 2 diabetes mellitus with other specified complication: Secondary | ICD-10-CM | POA: Diagnosis not present

## 2018-10-26 DIAGNOSIS — L723 Sebaceous cyst: Secondary | ICD-10-CM

## 2018-10-26 DIAGNOSIS — J449 Chronic obstructive pulmonary disease, unspecified: Secondary | ICD-10-CM

## 2018-10-26 LAB — COMPREHENSIVE METABOLIC PANEL
ALT: 33 U/L (ref 0–53)
AST: 25 U/L (ref 0–37)
Albumin: 4.4 g/dL (ref 3.5–5.2)
Alkaline Phosphatase: 106 U/L (ref 39–117)
BUN: 21 mg/dL (ref 6–23)
CO2: 29 mEq/L (ref 19–32)
Calcium: 10.7 mg/dL — ABNORMAL HIGH (ref 8.4–10.5)
Chloride: 103 mEq/L (ref 96–112)
Creatinine, Ser: 0.98 mg/dL (ref 0.40–1.50)
GFR: 77.32 mL/min (ref >60.00)
Glucose, Bld: 157 mg/dL — ABNORMAL HIGH (ref 70–99)
Potassium: 4 mEq/L (ref 3.5–5.1)
Sodium: 141 mEq/L (ref 135–145)
Total Bilirubin: 0.4 mg/dL (ref 0.2–1.2)
Total Protein: 6.6 g/dL (ref 6.0–8.3)

## 2018-10-26 LAB — LIPID PANEL
Cholesterol: 174 mg/dL (ref 0–200)
HDL: 43.8 mg/dL (ref >39.00)
Total CHOL/HDL Ratio: 4
Triglycerides: 505 mg/dL — ABNORMAL HIGH (ref 0.0–149.0)

## 2018-10-26 LAB — MICROALBUMIN / CREATININE URINE RATIO
Creatinine,U: 119.1 mg/dL
Microalb Creat Ratio: 0.8 mg/g (ref 0.0–30.0)
Microalb, Ur: 0.9 mg/dL (ref 0.0–1.9)

## 2018-10-26 LAB — LDL CHOLESTEROL, DIRECT: Direct LDL: 86 mg/dL

## 2018-10-26 LAB — HEMOGLOBIN A1C: Hgb A1c MFr Bld: 7.2 % — ABNORMAL HIGH (ref 4.6–6.5)

## 2018-10-26 MED ORDER — DULOXETINE HCL 60 MG PO CPEP: ORAL_CAPSULE | ORAL | 2 refills | Status: DC

## 2018-10-26 NOTE — Patient Instructions (Addendum)
A few things to remember from today's visit:   Need for shingles vaccine - Plan: Varicella-zoster vaccine IM  Need for immunization against influenza - Plan: Flu Vaccine QUAD 36+ mos IM  Lumbar radicular pain - Plan: DULoxetine (CYMBALTA) 60 MG capsule  Essential hypertension - Plan: Comprehensive metabolic panel  Diabetes mellitus type II, non insulin dependent ([place]) - Plan: Comprehensive metabolic panel, Hemoglobin A1c, Microalbumin / creatinine urine ratio  Hyperlipidemia associated with type 2 diabetes mellitus ([place]) - Plan: Comprehensive metabolic panel, Lipid panel  No changes in your medications today.  Epidermal Cyst  An epidermal cyst is a small, painless lump under your skin. The cyst contains a grayish-white, bad-smelling substance (keratin). Do not try to pop or open an epidermal cyst yourself. What are the causes?  A blocked hair follicle.  A hair that curls and re-enters the skin instead of growing straight out of the skin.  A blocked pore.  Irritated skin.  An injury to the skin.  Certain conditions that are passed along from parent to child (inherited).  Human papillomavirus (HPV).  Long-term sun damage to the skin. What increases the risk?  Having acne.  Being overweight.  Being 48-53 years old. What are the signs or symptoms? These cysts are usually harmless, but they can get infected. Symptoms of infection may include:  Redness.  Inflammation.  Tenderness.  Warmth.  Fever.  A grayish-white, bad-smelling substance drains from the cyst.  Pus drains from the cyst. How is this treated? In many cases, epidermal cysts go away on their own without treatment. If a cyst becomes infected, treatment may include:  Opening and draining the cyst, done by a doctor. After draining, you may need minor surgery to remove the rest of the cyst.  Antibiotic medicine.  Shots of medicines (steroids) that help to reduce inflammation.  Surgery to remove  the cyst. Surgery may be done if the cyst: ? Becomes large. ? Bothers you. ? Has a chance of turning into cancer.  Do not try to open a cyst yourself. Follow these instructions at home:  Take over-the-counter and prescription medicines only as told by your doctor.  If you were prescribed an antibiotic medicine, take it it as told by your doctor. Do not stop using the antibiotic even if you start to feel better.  Keep the area around your cyst clean and dry.  Wear loose, dry clothing.  Avoid touching your cyst.  Check your cyst every day for signs of infection. Check for: ? Redness, swelling, or pain. ? Fluid or blood. ? Warmth. ? Pus or a bad smell.  Keep all follow-up visits as told by your doctor. This is important. How is this prevented?  Wear clean, dry, clothing.  Avoid wearing tight clothing.  Keep your skin clean and dry. Take showers or baths every day. Contact a doctor if:  Your cyst has symptoms of infection.  Your condition does not improve or gets worse.  You have a cyst that looks different from other cysts you have had.  You have a fever. Get help right away if:  Redness spreads from the cyst into the area close by. Summary  An epidermal cyst is a sac made of skin tissue.  If a cyst becomes infected, treatment may include surgery to open and drain the cyst, or to remove it.  Take over-the-counter and prescription medicines only as told by your doctor.  Contact a doctor if your condition is not improving or is getting worse.  Keep all follow-up visits as told by your doctor. This is important. This information is not intended to replace advice given to you by your health care provider. Make sure you discuss any questions you have with your health care provider. Document Released: 01/30/2004 Document Revised: 04/14/2018 Document Reviewed: 09/30/2017 Elsevier Patient Education  [place].   Please be sure medication list is accurate. If  a new problem present, please set up appointment sooner than planned today.

## 2018-10-26 NOTE — Progress Notes (Signed)
HPI:   BenjaminBenjamin Conner is a 62 y.o. male, who is here today for chronic disease management. He was last seen on 08/11/2017. No new problems since his last visit.  Hyperlipidemia: Currently he is on atorvastatin 80 mg daily. He is tolerating medication well. He has not been consistent with following low-fat diet.  Lab Results  Component Value Date   CHOL 168 08/11/2017   HDL 48.80 08/11/2017   LDLCALC 80 08/11/2017   LDLDIRECT 79.0 01/19/2017   TRIG 195.0 (H) 08/11/2017   CHOLHDL 3 08/11/2017   DM2: Dx'ed in 2017. Currently he is on nonpharmacologic treatment. He is not checking BS. Denies abdominal pain, nausea,vomiting, polydipsia,polyuria, or polyphagia.   Lab Results  Component Value Date   HGBA1C 5.9 (A) 08/11/2017   Lab Results  Component Value Date   MICROALBUR 0.9 07/17/2016   Hypertension: Dx'ed in 2010. Currently he is on valsartan-HCTZ 160-25 mg daily. He is not checking BP at home Denies severe/frequent headache, visual changes, chest pain, dyspnea, palpitation, claudication, focal weakness, or edema.  Lab Results  Component Value Date   CREATININE 0.96 08/11/2017   BUN 17 08/11/2017   NA 139 08/11/2017   K 4.4 08/11/2017   CL 104 08/11/2017   CO2 28 08/11/2017    Today he is concerned about tender and pruritic lesion in the middle of his back noted over 6 months ago, it seems to be stable in size. Negative for fever, chills, or easy bleeding. He has not tried   Chronic back pain and OA: Currently is on Cymbalta 60 mg daily, which has helped and has been well-tolerated. Cymbalta started in 01/2017. Achy/sharp intermittent pain. Throbbing ankles pain, noted first thing is the morning and alleviated by walking a few steps.  Occasionally back pain is radiated to RLE + numbness. He has seen ortho.  Lower back pain exacerbated by prolonged standing or walking. Negative for saddle anesthesia or changes in urine/bowel function.   Review of Systems  Constitutional: Negative for activity change, appetite change, fatigue, fever and unexpected weight change.  HENT: Negative for nosebleeds, sore throat and trouble swallowing.   Eyes: Negative for redness and visual disturbance.  Respiratory: Negative for cough and wheezing.   Gastrointestinal: Negative for blood in stool.       No changes in bowel movements.  Genitourinary: Negative for decreased urine volume and hematuria.  Musculoskeletal: Positive for arthralgias. Negative for gait problem.  Skin: Negative for rash and wound.  Neurological: Negative for syncope and facial asymmetry.  Rest see pertinent positives and negatives per HPI.   Current Outpatient Medications on File Prior to Visit  Medication Sig Dispense Refill  . albuterol (VENTOLIN HFA) 108 (90 Base) MCG/ACT inhaler Inhale 2 puffs into the lungs every 6 (six) hours as needed (cough, shortness of breath or wheezing). 18 g 1  . aspirin 81 MG tablet Take 81 mg by mouth daily.    Marland Kitchen atorvastatin (LIPITOR) 80 MG tablet TAKE 1 TABLET BY MOUTH EVERY DAY 90 tablet 3  . budesonide-formoterol (SYMBICORT) 80-4.5 MCG/ACT inhaler Inhale 2 puffs into the lungs 2 (two) times daily. 3 Inhaler 1  . Cholecalciferol (VITAMIN D PO) Take by mouth.    . cyclobenzaprine (FLEXERIL) 5 MG tablet Take 2 tabs bid 90 tablet 1  . esomeprazole (NEXIUM) 40 MG capsule Take 1 capsule (40 mg total) by mouth daily. NO MORE REFILLS WITHOUT OFFICE VISIT - 2ND NOTICE 15 capsule 0  . fexofenadine (ALLEGRA) 180  MG tablet Take 180 mg by mouth daily as needed.     . finasteride (PROSCAR) 5 MG tablet Take 5 mg by mouth daily.    Marland Kitchen oxybutynin (DITROPAN-XL) 5 MG 24 hr tablet TAKE 1 TABLET BY MOUTH EVERY DAY 90 tablet 1  . sildenafil (REVATIO) 20 MG tablet Take 1-5 tablets orally as needed    . solifenacin (VESICARE) 5 MG tablet Take 2 tablets (10 mg total) by mouth daily. 30 tablet 1  . tamsulosin (FLOMAX) 0.4 MG CAPS capsule Take 1 capsule (0.4 mg  total) by mouth daily. 90 capsule 1  . triamcinolone ointment (KENALOG) 0.5 % Apply 1 application topically 2 (two) times daily as needed. 45 g 1  . valsartan-hydrochlorothiazide (DIOVAN-HCT) 160-25 MG tablet Take 1 tablet by mouth daily. 90 tablet 2  . fluticasone (FLONASE) 50 MCG/ACT nasal spray Place 2 sprays into both nostrils daily. 48 g 3   No current facility-administered medications on file prior to visit.      Past Medical History:  Diagnosis Date  . BACK PAIN    chronic  . COPD (chronic obstructive pulmonary disease) ([place])   . GERD   . Hyperlipidemia   . HYPERTENSION   . HYPERTROPHY PROSTATE W/UR OBST & OTH LUTS   . Nocturia   . Numbness of hand    since 2005  . OSTEOARTHRITIS   . PEPTIC ULCER DISEASE   . Personal history of colonic adenomas 09/30/2012  . Pneumonia   . Snoring disorder    Allergies  Allergen Reactions  . Asa [Aspirin]     Reacts as a sleeping pill    Social History   Socioeconomic History  . Marital status: Married    Spouse name: [name]  . Number of children: 2  . Years of education: 31  . Highest education level: Not on file  Occupational History  . Occupation: Scientist, water quality    Comment: Aeronautical engineer  Social Needs  . Financial resource strain: Not on file  . Food insecurity    Worry: Not on file    Inability: Not on file  . Transportation needs    Medical: Not on file    Non-medical: Not on file  Tobacco Use  . Smoking status: Former Smoker    Packs/day: 1.00    Years: 36.00    Pack years: 36.00    Types: Cigarettes    Start date: 01/06/1971    Quit date: 01/06/2007    Years since quitting: 11.8  . Smokeless tobacco: Never Used  . Tobacco comment: quit 8\2009  Substance and Sexual Activity  . Alcohol use: Yes    Alcohol/week: 3.0 standard drinks    Types: 3 Shots of liquor per week    Comment: 2-3 daily  . Drug use: No  . Sexual activity: Yes  Lifestyle  . Physical activity    Days per week: Not on file    Minutes per  session: Not on file  . Stress: Not on file  Relationships  . Social Herbalist on phone: Not on file    Gets together: Not on file    Attends religious service: Not on file    Active member of club or organization: Not on file    Attends meetings of clubs or organizations: Not on file    Relationship status: Not on file  Other Topics Concern  . Not on file  Social History Narrative   Patient is married to Banks. Patient has a high  school education. Patient is a Scientist, water quality for [employer].   Caffeine- four daily.   Left handed.    Vitals:   10/26/18 1055  BP: 110/60  Pulse: 72  Resp: 16  Temp: (!) 97.2 F (36.2 C)   Body mass index is 29.15 kg/m.   Physical Exam  Nursing note and vitals reviewed. Constitutional: He is oriented to person, place, and time. He appears well-developed. No distress.  HENT:  Head: Normocephalic and atraumatic.  Mouth/Throat: Oropharynx is clear and moist and mucous membranes are normal.  Eyes: Pupils are equal, round, and reactive to light. Conjunctivae are normal.  Cardiovascular: Normal rate and regular rhythm.  Murmur (Soft SEM LUSB) heard. Pulses:      Dorsalis pedis pulses are 2+ on the right side and 2+ on the left side.  Respiratory: Effort normal and breath sounds normal. No respiratory distress.  GI: Soft. He exhibits no mass. There is no hepatomegaly. There is no abdominal tenderness.  Musculoskeletal:        General: No edema.  Lymphadenopathy:    He has no cervical adenopathy.  Neurological: He is alert and oriented to person, place, and time. He has normal strength. No cranial nerve deficit. Gait normal.  Skin: Skin is warm. No rash noted. No erythema.     Left-sided mid back with nodular elsion,about 3 cm,central black punctuate area. No active drainage,no erythema or tenderness.  Psychiatric: He has a normal mood and affect. Cognition and memory are normal.  Well groomed, good eye contact.   ASSESSMENT AND  PLAN:  Benjamin Conner was seen today for medication management.  Diagnoses and all orders for this visit:  Lab Results  Component Value Date   HGBA1C 7.2 (H) 10/26/2018   Lab Results  Component Value Date   CREATININE 0.98 10/26/2018   BUN 21 10/26/2018   NA 141 10/26/2018   K 4.0 10/26/2018   CL 103 10/26/2018   CO2 29 10/26/2018   Lab Results  Component Value Date   ALT 33 10/26/2018   AST 25 10/26/2018   ALKPHOS 106 10/26/2018   BILITOT 0.4 10/26/2018   Lab Results  Component Value Date   CHOL 174 10/26/2018   HDL 43.80 10/26/2018   LDLCALC 80 08/11/2017   LDLDIRECT 86.0 10/26/2018   TRIG (H) 10/26/2018    505.0 Triglyceride is over 400; calculations on Lipids are invalid.   CHOLHDL 4 10/26/2018   Lab Results  Component Value Date   MICROALBUR 0.9 10/26/2018     Need for shingles vaccine -     Varicella-zoster vaccine IM  Need for immunization against influenza -     Flu Vaccine QUAD 36+ mos IM  Lumbar radicular pain Stable. Cymbalta still helping. No changes in current management.  -     DULoxetine (CYMBALTA) 60 MG capsule; TAKE 1 CAPSULE BY MOUTH EVERY DAY  Essential hypertension BP adequately controlled. Recommend monitoring BP at home. No changes in current management. Low salt diet recommended.  -     Comprehensive metabolic panel  Diabetes mellitus type II, non insulin dependent ([place]) HgA1C has been at goal. Continue non pharmacologic treatment. Regular exercise and healthy diet with avoidance of added sugar food intake is an important part of treatment and recommended. Annual eye exam, periodic dental and foot care recommended. F/U in 5-6 months  -     Comprehensive metabolic panel -     Hemoglobin A1c -     Microalbumin / creatinine urine ratio  Hyperlipidemia  associated with type 2 diabetes mellitus (HCC) No changes in current management, will follow labs done today and will give further recommendations accordingly.  -      Comprehensive metabolic panel -     Lipid panel  Sebaceous cyst Reassured. Monitor for signs of infection. He is not interested in removal for now,he will let me know if he changes up his mind.   Return in about 6 months (around 04/26/2019) for DM II,HTN.   -Mr. Benjamin Conner was advised to return sooner than planned today if new concerns arise.       [name]. Martinique, MD  [facility]. [place] office.

## 2018-10-28 ENCOUNTER — Encounter: Payer: Self-pay | Admitting: Family Medicine

## 2018-10-31 MED ORDER — VALSARTAN-HYDROCHLOROTHIAZIDE 160-25 MG PO TABS: 1.0000 | ORAL_TABLET | Freq: Every day | ORAL | 2 refills | Status: DC

## 2018-10-31 MED ORDER — ALBUTEROL SULFATE HFA 108 (90 BASE) MCG/ACT IN AERS: 2.0000 | INHALATION_SPRAY | Freq: Four times a day (QID) | RESPIRATORY_TRACT | 1 refills | Status: DC | PRN

## 2018-10-31 MED ORDER — DULOXETINE HCL 60 MG PO CPEP: ORAL_CAPSULE | ORAL | 2 refills | Status: DC

## 2018-10-31 NOTE — Addendum Note (Signed)
Addended by: Martinique, [name] on: 10/31/2018 10:50 AM   Modules accepted: Orders

## 2018-10-31 NOTE — Addendum Note (Signed)
Addended by: Martinique, [name] on: 10/31/2018 10:52 AM   Modules accepted: Orders

## 2018-11-16 ENCOUNTER — Encounter: Payer: Self-pay | Admitting: Family Medicine

## 2019-03-06 ENCOUNTER — Encounter: Payer: Self-pay | Admitting: Family Medicine

## 2019-03-25 IMAGING — DX DG LUMBAR SPINE COMPLETE 4+V
5 series · 5 of 5 positions shown · non-contrast
Comparison: 05/08/2016

CLINICAL DATA: Chronic low back pain, no known injury, initial
encounter

EXAM:
LUMBAR SPINE - COMPLETE 4+ VIEW

[l-spine ap]
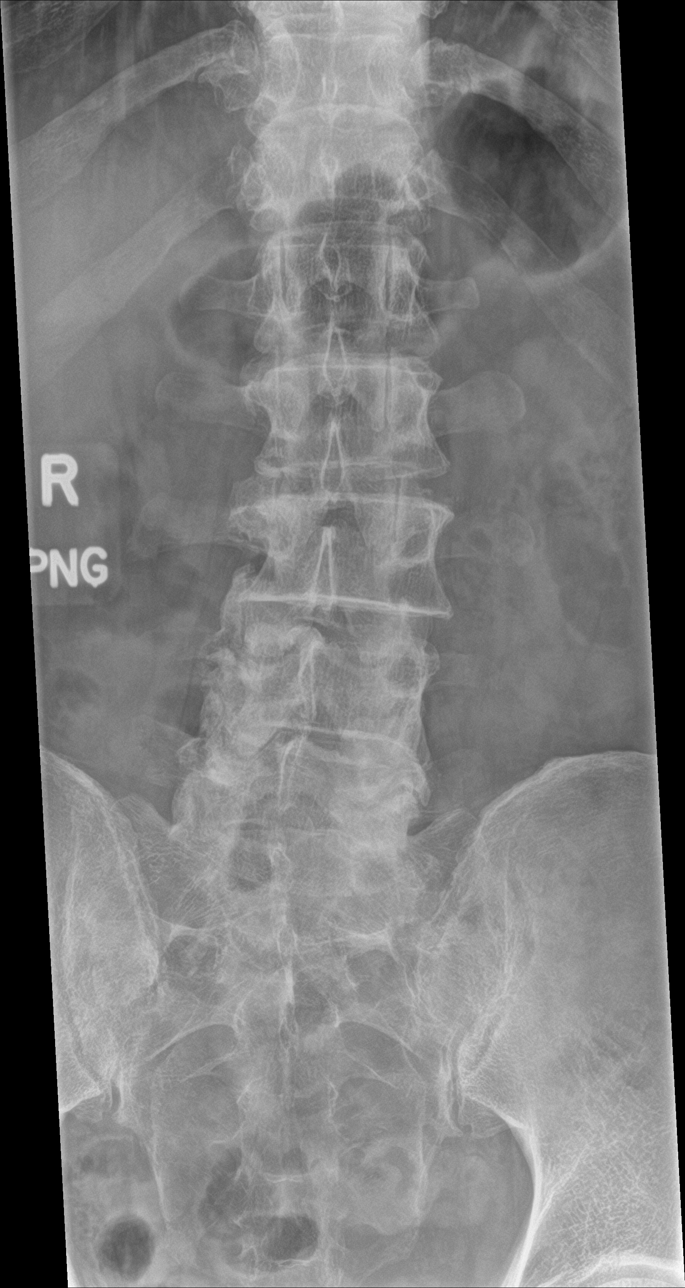

[l-spine obl (1 of 2)]
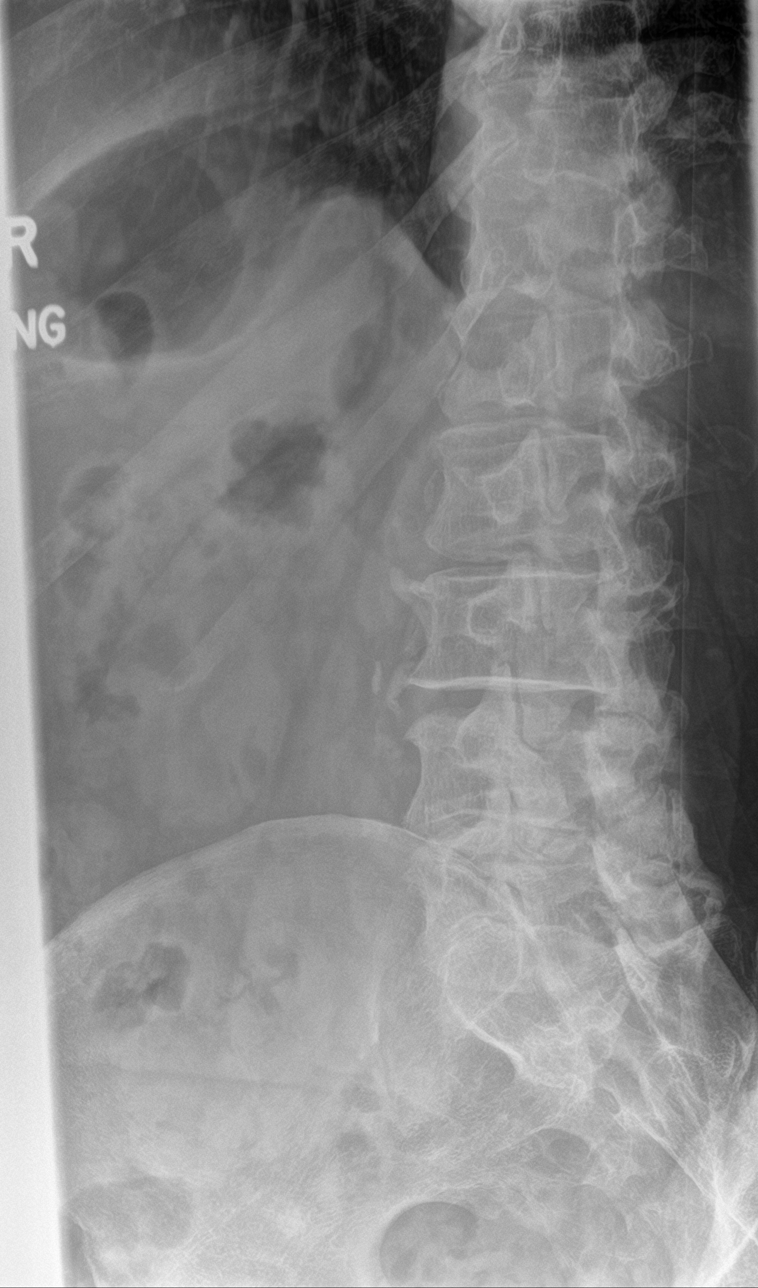

[l-spine obl (2 of 2)]
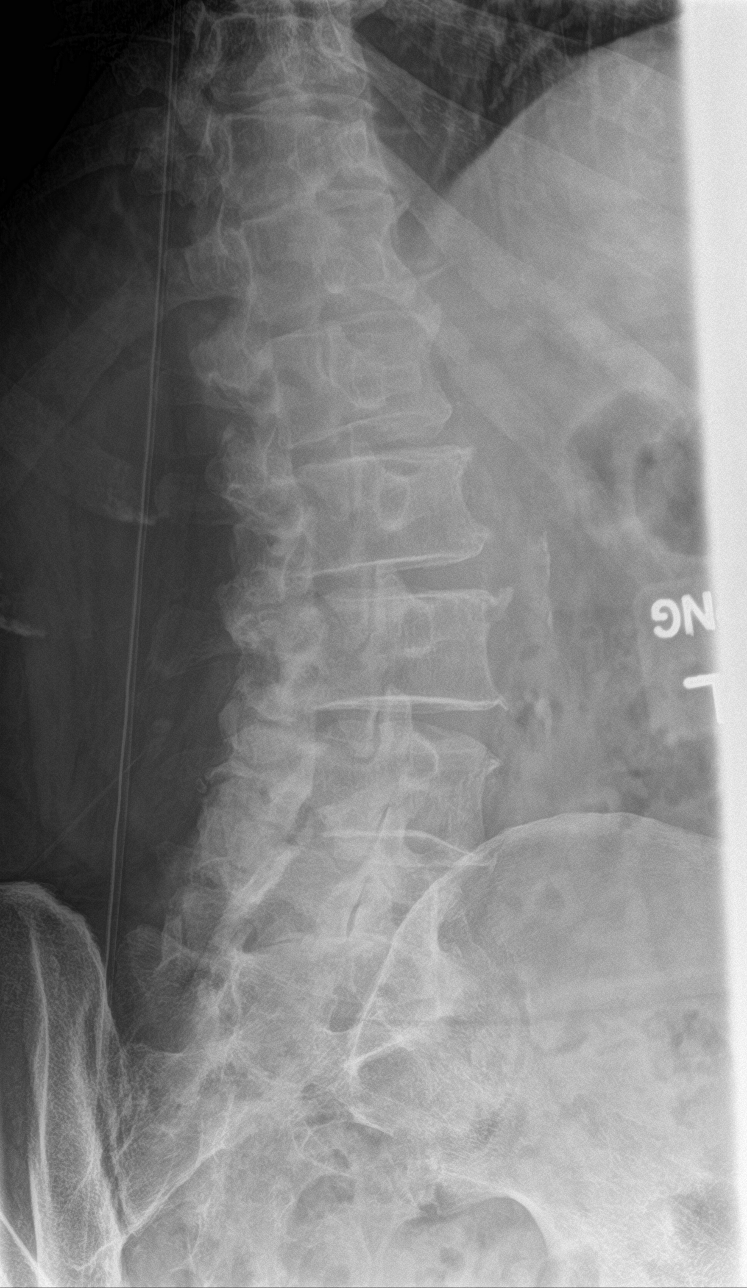

[l-spine lat]
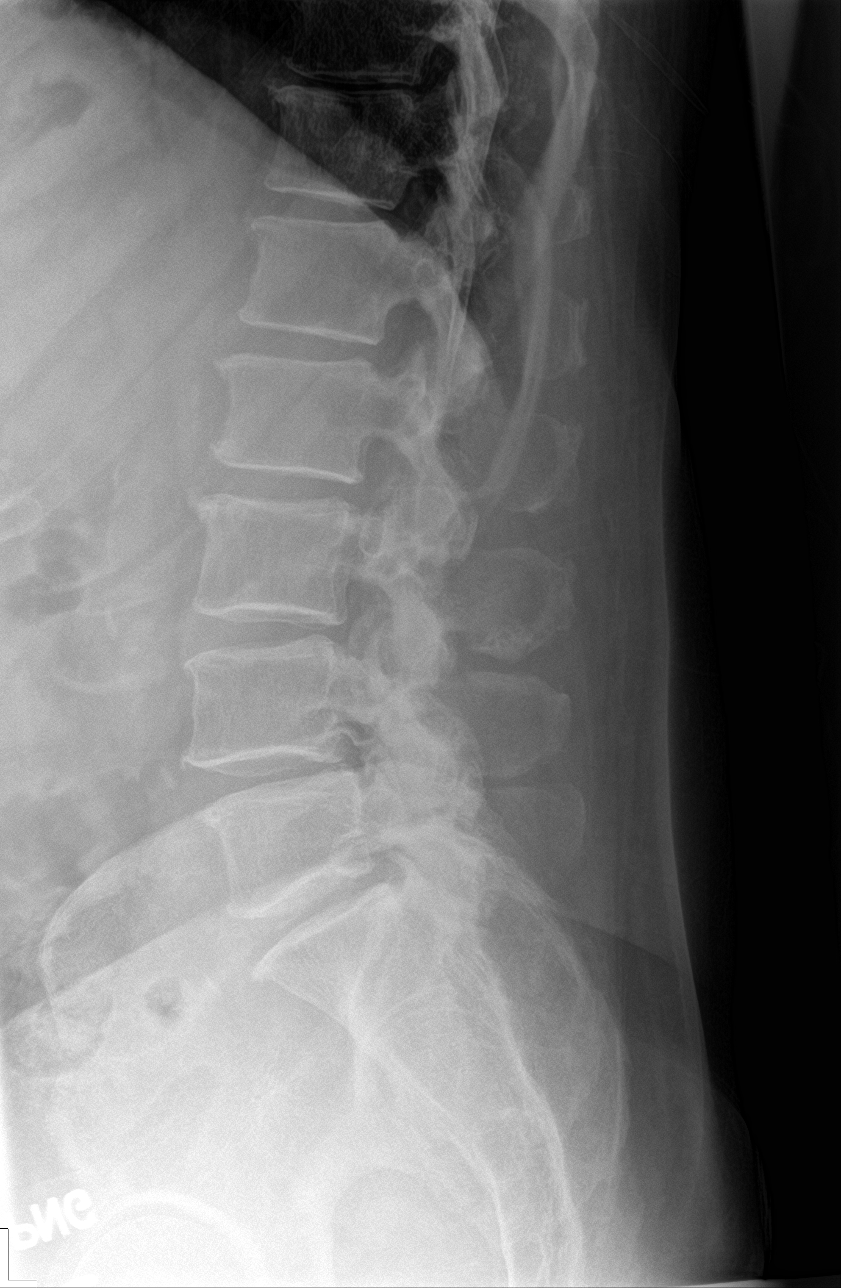

[l-spine spot]
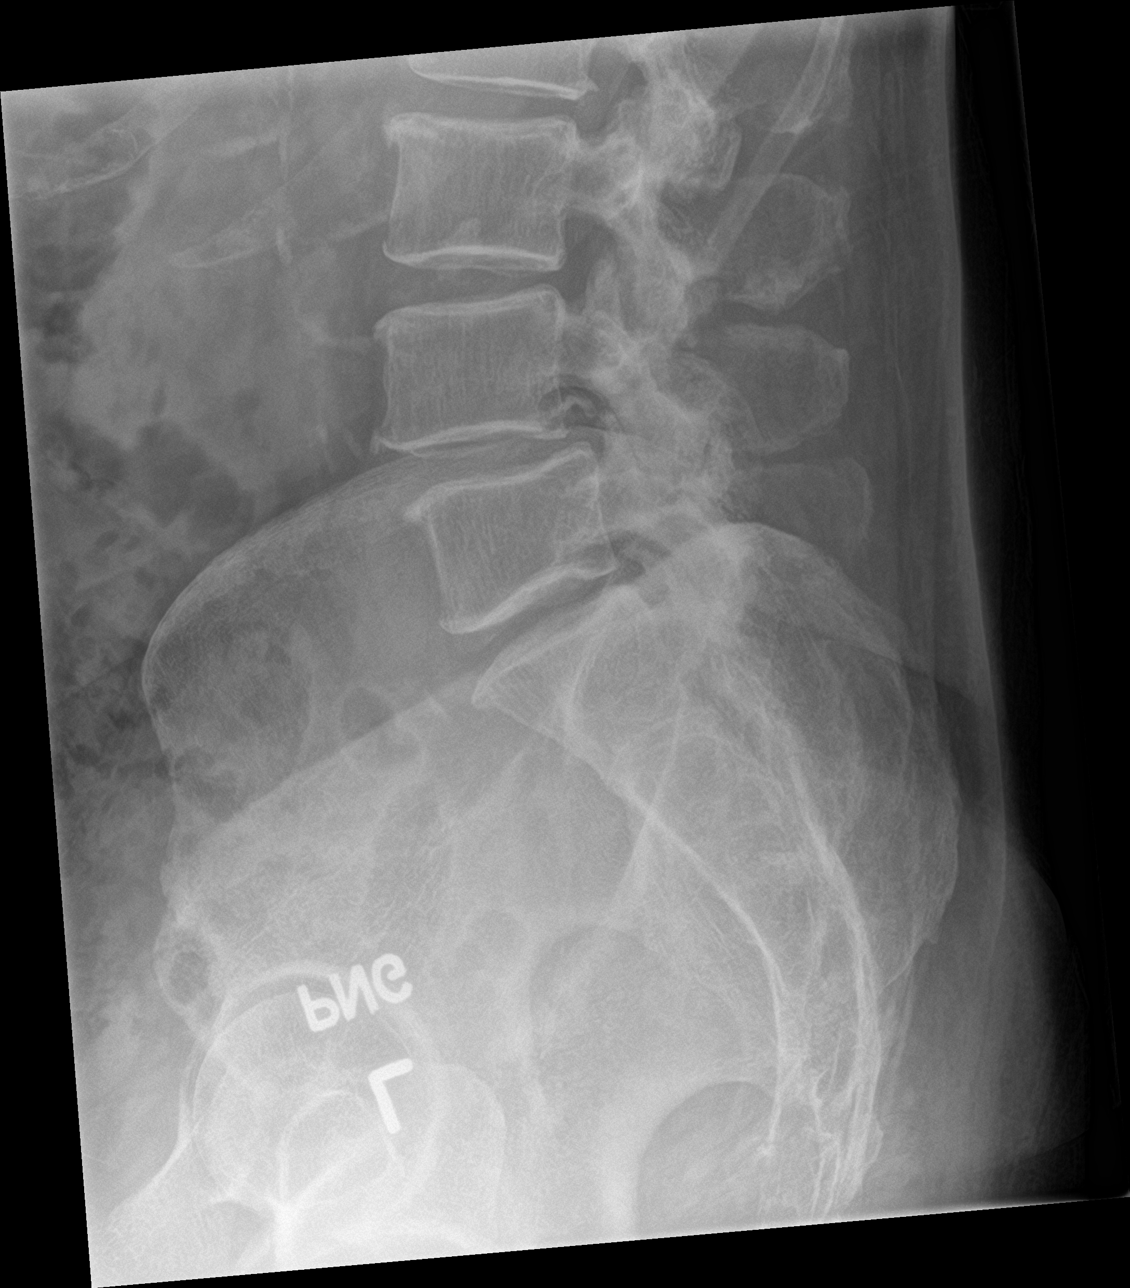

[5 of 5 positions shown; findings below may reference images not displayed]

FINDINGS: Five lumbar type vertebral bodies are well visualized. Vertebral
body height is well maintained. No pars defects are noted. Facet
hypertrophic changes are seen. Mild anterolisthesis of L4 on L5 is
again noted and stable in appearance.. Mild osteophytic changes are
seen. No soft tissue abnormality is seen.
IMPRESSION: Degenerative change with mild anterolisthesis of L4 on L5.

## 2019-03-31 ENCOUNTER — Ambulatory Visit: Payer: BC Managed Care – PPO | Attending: Internal Medicine

## 2019-03-31 DIAGNOSIS — Z23 Encounter for immunization: Secondary | ICD-10-CM

## 2019-03-31 NOTE — Progress Notes (Signed)
   Covid-19 Vaccination Clinic  Name:  Tryston Nobis    MRN: [id number] DOB: [birth year]  03/31/2019  Mr. Challis was observed post Covid-19 immunization for 15 minutes without incident. He was provided with Vaccine Information Sheet and instruction to access the V-Safe system.   Mr. Wayman was instructed to call 911 with any severe reactions post vaccine: Marland Kitchen Difficulty breathing  . Swelling of face and throat  . A fast heartbeat  . A bad rash all over body  . Dizziness and weakness   Immunizations Administered    Name Date Dose VIS Date Route   Pfizer COVID-19 Vaccine 03/31/2019  3:45 PM 0.3 mL 12/16/2018 Intramuscular   Manufacturer: [place]   Lot: [id number]   [place]: [id number]

## 2019-04-16 ENCOUNTER — Other Ambulatory Visit: Payer: Self-pay | Admitting: Family Medicine

## 2019-04-25 ENCOUNTER — Ambulatory Visit: Payer: BC Managed Care – PPO | Attending: Internal Medicine

## 2019-04-25 DIAGNOSIS — Z23 Encounter for immunization: Secondary | ICD-10-CM

## 2019-04-25 NOTE — Progress Notes (Signed)
   Covid-19 Vaccination Clinic  Name:  Benjamin Conner    MRN: [id number] DOB: 1955-02-01  04/25/2019  [name] was observed post Covid-19 immunization for 15 minutes without incident. He was provided with Vaccine Information Sheet and instruction to access the V-Safe system.   [name] was instructed to call 911 with any severe reactions post vaccine: Marland Kitchen Difficulty breathing  . Swelling of face and throat  . A fast heartbeat  . A bad rash all over body  . Dizziness and weakness   Immunizations Administered    Name Date Dose VIS Date Route   Pfizer COVID-19 Vaccine 04/25/2019  3:04 PM 0.3 mL 03/01/2018 Intramuscular   Manufacturer: [place]   Lot: [id number]   [place]: [id number]

## 2019-06-10 ENCOUNTER — Other Ambulatory Visit: Payer: Self-pay | Admitting: Family Medicine

## 2019-06-10 DIAGNOSIS — E785 Hyperlipidemia, unspecified: Secondary | ICD-10-CM

## 2019-06-10 DIAGNOSIS — E1169 Type 2 diabetes mellitus with other specified complication: Secondary | ICD-10-CM

## 2019-08-30 ENCOUNTER — Other Ambulatory Visit: Payer: Self-pay | Admitting: Family Medicine

## 2019-08-30 DIAGNOSIS — M5416 Radiculopathy, lumbar region: Secondary | ICD-10-CM

## 2019-11-06 ENCOUNTER — Other Ambulatory Visit: Payer: Self-pay | Admitting: Family Medicine

## 2019-11-06 DIAGNOSIS — I10 Essential (primary) hypertension: Secondary | ICD-10-CM

## 2020-01-29 ENCOUNTER — Other Ambulatory Visit: Payer: Self-pay | Admitting: Family Medicine

## 2020-01-29 DIAGNOSIS — I10 Essential (primary) hypertension: Secondary | ICD-10-CM

## 2020-01-30 ENCOUNTER — Encounter: Payer: Self-pay | Admitting: Family Medicine

## 2020-01-30 DIAGNOSIS — R059 Cough, unspecified: Secondary | ICD-10-CM | POA: Diagnosis not present

## 2020-01-30 DIAGNOSIS — U071 COVID-19: Secondary | ICD-10-CM | POA: Diagnosis not present

## 2020-01-30 DIAGNOSIS — J029 Acute pharyngitis, unspecified: Secondary | ICD-10-CM | POA: Diagnosis not present

## 2020-01-30 DIAGNOSIS — Z20822 Contact with and (suspected) exposure to covid-19: Secondary | ICD-10-CM | POA: Diagnosis not present

## 2020-02-19 ENCOUNTER — Other Ambulatory Visit: Payer: Self-pay

## 2020-03-08 ENCOUNTER — Encounter: Payer: Self-pay | Admitting: Family Medicine

## 2020-03-08 ENCOUNTER — Ambulatory Visit: Payer: BC Managed Care – PPO | Admitting: Family Medicine

## 2020-03-08 ENCOUNTER — Other Ambulatory Visit: Payer: Self-pay

## 2020-03-08 VITALS — BP 124/70 | HR 80 | Resp 16 | Ht 75.0 in | Wt 226.4 lb

## 2020-03-08 DIAGNOSIS — L723 Sebaceous cyst: Secondary | ICD-10-CM

## 2020-03-08 DIAGNOSIS — E785 Hyperlipidemia, unspecified: Secondary | ICD-10-CM

## 2020-03-08 DIAGNOSIS — E1169 Type 2 diabetes mellitus with other specified complication: Secondary | ICD-10-CM | POA: Diagnosis not present

## 2020-03-08 DIAGNOSIS — L02212 Cutaneous abscess of back [any part, except buttock]: Secondary | ICD-10-CM | POA: Diagnosis not present

## 2020-03-08 DIAGNOSIS — R350 Frequency of micturition: Secondary | ICD-10-CM

## 2020-03-08 DIAGNOSIS — E119 Type 2 diabetes mellitus without complications: Secondary | ICD-10-CM | POA: Diagnosis not present

## 2020-03-08 DIAGNOSIS — I10 Essential (primary) hypertension: Secondary | ICD-10-CM | POA: Diagnosis not present

## 2020-03-08 LAB — MICROALBUMIN / CREATININE URINE RATIO
Creatinine,U: 96.8 mg/dL
Microalb Creat Ratio: 1.5 mg/g (ref 0.0–30.0)
Microalb, Ur: 1.4 mg/dL (ref 0.0–1.9)

## 2020-03-08 LAB — COMPREHENSIVE METABOLIC PANEL
ALT: 19 U/L (ref 0–53)
AST: 13 U/L (ref 0–37)
Albumin: 3.8 g/dL (ref 3.5–5.2)
Alkaline Phosphatase: 97 U/L (ref 39–117)
BUN: 19 mg/dL (ref 6–23)
CO2: 29 mEq/L (ref 19–32)
Calcium: 10.3 mg/dL (ref 8.4–10.5)
Chloride: 99 mEq/L (ref 96–112)
Creatinine, Ser: 0.79 mg/dL (ref 0.40–1.50)
GFR: 94.16 mL/min (ref >60.00)
Glucose, Bld: 233 mg/dL — ABNORMAL HIGH (ref 70–99)
Potassium: 4.1 mEq/L (ref 3.5–5.1)
Sodium: 137 mEq/L (ref 135–145)
Total Bilirubin: 0.4 mg/dL (ref 0.2–1.2)
Total Protein: 6.3 g/dL (ref 6.0–8.3)

## 2020-03-08 LAB — LIPID PANEL
Cholesterol: 179 mg/dL (ref 0–200)
HDL: 42.5 mg/dL (ref >39.00)
LDL Cholesterol: 101 mg/dL — ABNORMAL HIGH (ref 0–99)
NonHDL: 136.72
Total CHOL/HDL Ratio: 4
Triglycerides: 177 mg/dL — ABNORMAL HIGH (ref 0.0–149.0)
VLDL: 35.4 mg/dL (ref 0.0–40.0)

## 2020-03-08 LAB — HEMOGLOBIN A1C: Hgb A1c MFr Bld: 8.3 % — ABNORMAL HIGH (ref 4.6–6.5)

## 2020-03-08 MED ORDER — SULFAMETHOXAZOLE-TRIMETHOPRIM 800-160 MG PO TABS: 1.0000 | ORAL_TABLET | Freq: Two times a day (BID) | ORAL | 0 refills | Status: AC

## 2020-03-08 NOTE — Progress Notes (Signed)
Chief Complaint  Patient presents with  . cyst on back   HPI: [name] is a 65 y.o. male with hx of DM II,HTN,chronic pain, and COPD here today with above complaint. He has had intermittent episodes of back abscess, he has a sebaceous cyst. A few days ago lesion started growing and very tender, today he noted some drainage. Pain is exacerbated by palpation and certain movements.  He has not noted fever,chills,changes in appetite,or myalgias. He has not tried OTC medications.  His last follow up visit on 10/26/18, he would like to have blood work today.  DM II: Dx'ed in 2017. He is on non pharmacologic treatment. Metformin was recommended after last labs. Negative for polydipsia,polyuria, or polyphagia. He is not checking BS's.  Lab Results  Component Value Date   HGBA1C 7.2 (H) 10/26/2018   HTN: Dx'ed in 2010. Negative for severe/frequent headache, visual changes, chest pain, dyspnea, palpitation,focal weakness, or edema. He is on Valsartan-HCTZ 160-25 mg daily. He is not checking BP regularly.  Component     Latest Ref Rng & Units 10/26/2018  Sodium     135 - 145 mEq/L 141  Potassium     3.5 - 5.1 mEq/L 4.0  Chloride     96 - 112 mEq/L 103  CO2     19 - 32 mEq/L 29  Glucose     70 - 99 mg/dL 157 (H)  BUN     6 - 23 mg/dL 21  Creatinine     0.40 - 1.50 mg/dL 0.98   HLD: He is on Atorvastatin 80 mg daily. Tolerating medication well.  Component     Latest Ref Rng & Units 10/26/2018  Cholesterol     0 - 200 mg/dL 174  Triglycerides     0.0 - 149.0 mg/dL 505.0 Triglyceride is over 400; calculations on Lipids are invalid. (H)  HDL Cholesterol     >39.00 mg/dL 43.80  Total CHOL/HDL Ratio      4   Requesting refills on Oxybutynin XL 5, which has helped with urinary frequency. He is also on Proscar 5 mg, prescribed by his urologist.  Review of Systems  Constitutional: Negative for activity change and fatigue.  HENT: Negative for nosebleeds  and sore throat.   Eyes: Negative for redness and visual disturbance.  Respiratory: Negative for cough and wheezing.   Gastrointestinal: Negative for abdominal pain, nausea and vomiting.  Genitourinary: Negative for decreased urine volume, dysuria and hematuria.  Neurological: Negative for syncope, facial asymmetry and weakness.  Psychiatric/Behavioral: Negative for confusion.  Rest see pertinent positives and negatives per HPI.  Current Outpatient Medications on File Prior to Visit  Medication Sig Dispense Refill  . albuterol (VENTOLIN HFA) 108 (90 Base) MCG/ACT inhaler Inhale 2 puffs into the lungs every 6 (six) hours as needed (cough, shortness of breath or wheezing). 18 g 1  . aspirin 81 MG tablet Take 81 mg by mouth daily.    Marland Kitchen atorvastatin (LIPITOR) 80 MG tablet TAKE 1 TABLET BY MOUTH EVERY DAY 90 tablet 3  . budesonide-formoterol (SYMBICORT) 80-4.5 MCG/ACT inhaler Inhale 2 puffs into the lungs 2 (two) times daily. 3 Inhaler 1  . Cholecalciferol (VITAMIN D PO) Take by mouth.    . cyclobenzaprine (FLEXERIL) 5 MG tablet Take 2 tabs bid 90 tablet 1  . DULoxetine (CYMBALTA) 60 MG capsule TAKE 1 CAPSULE BY MOUTH EVERY DAY 90 capsule 2  . esomeprazole (NEXIUM) 40 MG capsule Take 1 capsule (40 mg total) by  mouth daily. NO MORE REFILLS WITHOUT OFFICE VISIT - 2ND NOTICE 15 capsule 0  . fexofenadine (ALLEGRA) 180 MG tablet Take 180 mg by mouth daily as needed.     . finasteride (PROSCAR) 5 MG tablet Take 5 mg by mouth daily.    . sildenafil (REVATIO) 20 MG tablet Take 1-5 tablets orally as needed    . triamcinolone ointment (KENALOG) 0.5 % Apply 1 application topically 2 (two) times daily as needed. 45 g 1  . valsartan-hydrochlorothiazide (DIOVAN-HCT) 160-25 MG tablet TAKE 1 TABLET BY MOUTH DAILY. DUE FOR F/U AND LABS. PLEASE CALL [phone]. 30 tablet 0  . fluticasone (FLONASE) 50 MCG/ACT nasal spray Place 2 sprays into both nostrils daily. 48 g 3   No current facility-administered  medications on file prior to visit.     Past Medical History:  Diagnosis Date  . BACK PAIN    chronic  . COPD (chronic obstructive pulmonary disease) ([place])   . GERD   . Hyperlipidemia   . HYPERTENSION   . HYPERTROPHY PROSTATE W/UR OBST & OTH LUTS   . Nocturia   . Numbness of hand    since 2005  . OSTEOARTHRITIS   . PEPTIC ULCER DISEASE   . Personal history of colonic adenomas 09/30/2012  . Pneumonia   . Snoring disorder    Allergies  Allergen Reactions  . Asa [Aspirin]     Reacts as a sleeping pill    Social History   Socioeconomic History  . Marital status: Married    Spouse name: Benjamin Conner  . Number of children: 2  . Years of education: 46  . Highest education level: Not on file  Occupational History  . Occupation: Scientist, water quality    Comment: [employer]  Tobacco Use  . Smoking status: Former Smoker    Packs/day: 1.00    Years: 36.00    Pack years: 36.00    Types: Cigarettes    Start date: 01/06/1971    Quit date: 01/06/2007    Years since quitting: 13.1  . Smokeless tobacco: Never Used  . Tobacco comment: quit 8\2009  Substance and Sexual Activity  . Alcohol use: Yes    Alcohol/week: 3.0 standard drinks    Types: 3 Shots of liquor per week    Comment: 2-3 daily  . Drug use: No  . Sexual activity: Yes  Other Topics Concern  . Not on file  Social History Narrative   Patient is married to Admire. Patient has a high school education. Patient is a Scientist, water quality for [employer].   Caffeine- four daily.   Left handed.   Social Determinants of Health   Financial Resource Strain: Not on file  Food Insecurity: Not on file  Transportation Needs: Not on file  Physical Activity: Not on file  Stress: Not on file  Social Connections: Not on file    Vitals:   03/08/20 1053  BP: 124/70  Pulse: 80  Resp: 16  SpO2: 99%   Body mass index is 28.29 kg/m.  Physical Exam Vitals and nursing note reviewed.  Constitutional:      General: He is not in acute distress.     Appearance: He is well-developed.  HENT:     Head: Normocephalic and atraumatic.  Eyes:     Conjunctiva/sclera: Conjunctivae normal.  Cardiovascular:     Rate and Rhythm: Normal rate and regular rhythm.     Pulses:          Dorsalis pedis pulses are 2+ on the right  side and 2+ on the left side.     Heart sounds: No murmur heard.   Pulmonary:     Effort: Pulmonary effort is normal. No respiratory distress.     Breath sounds: Normal breath sounds.  Abdominal:     Palpations: Abdomen is soft. There is no hepatomegaly or mass.     Tenderness: There is no abdominal tenderness.  Lymphadenopathy:     Cervical: No cervical adenopathy.  Skin:    General: Skin is warm.     Findings: Erythema and lesion present. No rash.          Comments: Left-sided mid back with 6-7 cm indurated/nodular lesion with erythematous center (4 cm) and fluctuant area. Punctuate purulent areas.  Neurological:     Mental Status: He is alert and oriented to person, place, and time.     Cranial Nerves: No cranial nerve deficit.     Gait: Gait normal.  Psychiatric:     Comments: Well groomed, good eye contact.    Diabetic Foot Exam - Simple   Simple Foot Form Diabetic Foot exam was performed with the following findings: Yes 03/08/2020  3:16 PM  Visual Inspection See comments: Yes Sensation Testing See comments: Yes Pulse Check Posterior Tibialis and Dorsalis pulse intact bilaterally: Yes Comments Mildly decreased monofilament left. Left bunion and callus. Thick toenails.     ASSESSMENT AND PLAN:  [name] was seen today for cyst on back.  Diagnoses and all orders for this visit: Orders Placed This Encounter  Procedures  . Comprehensive metabolic panel  . Fructosamine  . Hemoglobin A1c  . Lipid panel  . Microalbumin / creatinine urine ratio   Lab Results  Component Value Date   CHOL 179 03/08/2020   HDL 42.50 03/08/2020   LDLCALC 101 (H) 03/08/2020   LDLDIRECT 86.0 10/26/2018   TRIG  177.0 (H) 03/08/2020   CHOLHDL 4 03/08/2020   Lab Results  Component Value Date   MICROALBUR 1.4 03/08/2020   MICROALBUR 0.9 10/26/2018   Lab Results  Component Value Date   ALT 19 03/08/2020   AST 13 03/08/2020   ALKPHOS 97 03/08/2020   BILITOT 0.4 03/08/2020    Lab Results  Component Value Date   CREATININE 0.79 03/08/2020   BUN 19 03/08/2020   NA 137 03/08/2020   K 4.1 03/08/2020   CL 99 03/08/2020   CO2 29 03/08/2020   Lab Results  Component Value Date   HGBA1C 8.3 (H) 03/08/2020    Back abscess After verbal consent and in sterile fashion back abscess was I&D. Local anesthesia using plain Lidocaine 1% 3 cc, linear incision following natural skin lines was made (2.5 cm) with blade # 15. Copious purulent drainage was obtained. Also some sebaceous materia and sanguinolent drainage. Would pack with sterile iodoform gauze packing (1.5-2 ft). He tolerated procedure well.  -     sulfamethoxazole-trimethoprim (BACTRIM DS) 800-160 MG tablet; Take 1 tablet by mouth 2 (two) times daily for 7 days.  Diabetes mellitus type II, non insulin dependent ([place]) HgA1C has not been at goal. Continue non pharmacologic treatment, further recommendations according to HgA1C.  Annual eye exam, periodic dental and foot care recommended. F/U in 5-6 months  Hyperlipidemia associated with type 2 diabetes mellitus (HCC) Continue Atorvastatin 80 mg daily. Further recommendations according to lipid panel result.  Sebaceous cyst Infected. Will plan on completing removal once infection has resolved.  Essential hypertension BP adequately controlled. Continue Valsartan-HCTZ 160-25 mg daily and low salt diet.  Urinary  frequency Stable. No changes in Oxybutynin dose. Following with urologist as well.  -     oxybutynin (DITROPAN-XL) 5 MG 24 hr tablet; Take 1 tablet (5 mg total) by mouth daily.  Return in about 4 days (around 03/12/2020).   Carlethia Mesquita G. Martinique, MD  [facility]. [place] office.   A few things to remember from today's visit:   Back abscess  Hyperlipidemia associated with type 2 diabetes mellitus ([place]) - Plan: Comprehensive metabolic panel, Lipid panel  Diabetes mellitus type II, non insulin dependent ([place]) - Plan: Comprehensive metabolic panel, Fructosamine, Hemoglobin A1c, Microalbumin / creatinine urine ratio  Sebaceous cyst  Essential hypertension - Plan: Comprehensive metabolic panel  If you need refills please call your pharmacy. Do not use My Chart to request refills or for acute issues that need immediate attention.   Keep wound clean with soap and water. Monitor for fever or worsening pain. In 2 days you can pill little bit of packing.   Please be sure medication list is accurate. If a new problem present, please set up appointment sooner than planned today.

## 2020-03-08 NOTE — Patient Instructions (Addendum)
A few things to remember from today's visit:   Back abscess  Hyperlipidemia associated with type 2 diabetes mellitus ([place]) - Plan: Comprehensive metabolic panel, Lipid panel  Diabetes mellitus type II, non insulin dependent ([place]) - Plan: Comprehensive metabolic panel, Fructosamine, Hemoglobin A1c, Microalbumin / creatinine urine ratio  Sebaceous cyst  Essential hypertension - Plan: Comprehensive metabolic panel  If you need refills please call your pharmacy. Do not use My Chart to request refills or for acute issues that need immediate attention.   Keep wound clean with soap and water. Monitor for fever or worsening pain. In 2 days you can pill little bit of packing.   Please be sure medication list is accurate. If a new problem present, please set up appointment sooner than planned today.

## 2020-03-10 MED ORDER — METFORMIN HCL 850 MG PO TABS: 850.0000 mg | ORAL_TABLET | Freq: Two times a day (BID) | ORAL | 1 refills | Status: DC

## 2020-03-10 MED ORDER — OXYBUTYNIN CHLORIDE ER 5 MG PO TB24: 5.0000 mg | ORAL_TABLET | Freq: Every day | ORAL | 1 refills | Status: DC

## 2020-03-10 MED ORDER — ATORVASTATIN CALCIUM 80 MG PO TABS
80.0000 mg | ORAL_TABLET | Freq: Every day | ORAL | 3 refills | Status: DC
Start: 2020-03-10 — End: 2021-06-30

## 2020-03-12 ENCOUNTER — Ambulatory Visit: Payer: BC Managed Care – PPO | Admitting: Family Medicine

## 2020-03-12 ENCOUNTER — Encounter: Payer: Self-pay | Admitting: Family Medicine

## 2020-03-12 VITALS — BP 120/70 | HR 67 | Resp 16 | Ht 75.0 in | Wt 226.0 lb

## 2020-03-12 DIAGNOSIS — L723 Sebaceous cyst: Secondary | ICD-10-CM

## 2020-03-12 DIAGNOSIS — M5416 Radiculopathy, lumbar region: Secondary | ICD-10-CM

## 2020-03-12 DIAGNOSIS — E119 Type 2 diabetes mellitus without complications: Secondary | ICD-10-CM

## 2020-03-12 DIAGNOSIS — J449 Chronic obstructive pulmonary disease, unspecified: Secondary | ICD-10-CM | POA: Diagnosis not present

## 2020-03-12 DIAGNOSIS — L02212 Cutaneous abscess of back [any part, except buttock]: Secondary | ICD-10-CM

## 2020-03-12 LAB — FRUCTOSAMINE: Fructosamine: 305 umol/L — ABNORMAL HIGH (ref 205–285)

## 2020-03-12 MED ORDER — ACCU-CHEK SOFTCLIX LANCETS MISC: 12 refills | Status: AC

## 2020-03-12 MED ORDER — DULOXETINE HCL 60 MG PO CPEP: 60.0000 mg | ORAL_CAPSULE | Freq: Every day | ORAL | 2 refills | Status: DC

## 2020-03-12 MED ORDER — ACCU-CHEK AVIVA CONNECT W/DEVICE KIT: 1.0000 | PACK | Freq: Every day | 0 refills | Status: DC

## 2020-03-12 NOTE — Patient Instructions (Addendum)
A few things to remember from today's visit:   Sebaceous cyst - Plan: Ambulatory referral to General Surgery  Lumbar radicular pain - Plan: DULoxetine (CYMBALTA) 60 MG capsule  Back abscess  If you need refills please call your pharmacy. Do not use My Chart to request refills or for acute issues that need immediate attention.   Complete antibiotic treatment. Warm compresses. Appt with surgeon will be arranged.  Please be sure medication list is accurate. If a new problem present, please set up appointment sooner than planned today.

## 2020-03-12 NOTE — Progress Notes (Signed)
HPI:  Benjamin Conner is a 65 y.o. male, who is here today for follow-up. He was seen on 03/08/2020 for back abscess, underwent I&D, and wound was packed. He is currently on Bactrim DS twice daily, he has tolerated medication well. This morning he did pull the whole packing material  He has not noted much drainage. He has felt "warm" but has not checked his temperature. Negative for chills, changes in appetite, cough, wheezing, dyspnea, myalgias, abdominal pain, nausea, vomiting.  Chronic back pain: He is still on Cymbalta 60 mg daily, which is helping. Pain is usually worse at the end of the day. Exacerbated by prolonged standing/walking. Achy mild pain, not radiated. Occasionally he has RLE numbness. Negative for saddle anesthesia and bowel/bladder dysfunction.  COPD: He is no longer on Symbicort 80-4.5 mcg. He uses albuterol inhaler as needed, which is not frequent.  GERD: Currently he is on OTC Nexium 20 mg daily as needed.  DM II: Metformin 850 mg twice daily was recommended.  Lab Results  Component Value Date   HGBA1C 8.3 (H) 03/08/2020   Review of Systems  Constitutional: Negative for activity change and fatigue.  Respiratory: Negative for chest tightness and wheezing.   Allergic/Immunologic: Positive for environmental allergies.  Neurological: Negative for syncope, weakness and headaches.  Psychiatric/Behavioral: Negative for behavioral problems and confusion.  Rest see pertinent positives and negatives per HPI.  Current Outpatient Medications on File Prior to Visit  Medication Sig Dispense Refill   albuterol (VENTOLIN HFA) 108 (90 Base) MCG/ACT inhaler Inhale 2 puffs into the lungs every 6 (six) hours as needed (cough, shortness of breath or wheezing). 18 g 1   aspirin 81 MG tablet Take 81 mg by mouth daily.     atorvastatin (LIPITOR) 80 MG tablet Take 1 tablet (80 mg total) by mouth daily. 90 tablet 3   Cholecalciferol (VITAMIN D PO) Take by mouth.      esomeprazole (NEXIUM) 20 MG packet Take 20 mg by mouth daily as needed.     fexofenadine (ALLEGRA) 180 MG tablet Take 180 mg by mouth daily as needed.      finasteride (PROSCAR) 5 MG tablet Take 5 mg by mouth daily.     metFORMIN (GLUCOPHAGE) 850 MG tablet Take 1 tablet (850 mg total) by mouth 2 (two) times daily with a meal. 160 tablet 1   oxybutynin (DITROPAN-XL) 5 MG 24 hr tablet Take 1 tablet (5 mg total) by mouth daily. 90 tablet 1   sildenafil (REVATIO) 20 MG tablet Take 1-5 tablets orally as needed     sulfamethoxazole-trimethoprim (BACTRIM DS) 800-160 MG tablet Take 1 tablet by mouth 2 (two) times daily for 7 days. 14 tablet 0   triamcinolone ointment (KENALOG) 0.5 % Apply 1 application topically 2 (two) times daily as needed. 45 g 1   valsartan-hydrochlorothiazide (DIOVAN-HCT) 160-25 MG tablet TAKE 1 TABLET BY MOUTH DAILY. DUE FOR F/U AND LABS. PLEASE CALL [phone]. 30 tablet 0   fluticasone (FLONASE) 50 MCG/ACT nasal spray Place 2 sprays into both nostrils daily. 48 g 3   No current facility-administered medications on file prior to visit.   Past Medical History:  Diagnosis Date   BACK PAIN    chronic   COPD (chronic obstructive pulmonary disease) (HCC)    GERD    Hyperlipidemia    HYPERTENSION    HYPERTROPHY PROSTATE W/UR OBST & OTH LUTS    Nocturia    Numbness of hand    since 2005  OSTEOARTHRITIS    PEPTIC ULCER DISEASE    Personal history of colonic adenomas 09/30/2012   Pneumonia    Snoring disorder    Allergies  Allergen Reactions   Asa [Aspirin]     Reacts as a sleeping pill    Social History   Socioeconomic History   Marital status: Married    Spouse name: Benjamin Conner   Number of children: 2   Years of education: 12   Highest education level: Not on file  Occupational History   Occupation: Scientist, water quality    Comment: [employer]  Tobacco Use   Smoking status: Former Smoker    Packs/day: 1.00    Years: 36.00    Pack  years: 36.00    Types: Cigarettes    Start date: 01/06/1971    Quit date: 01/06/2007    Years since quitting: 13.1   Smokeless tobacco: Never Used   Tobacco comment: quit 8\2009  Substance and Sexual Activity   Alcohol use: Yes    Alcohol/week: 3.0 standard drinks    Types: 3 Shots of liquor per week    Comment: 2-3 daily   Drug use: No   Sexual activity: Yes  Other Topics Concern   Not on file  Social History Narrative   Patient is married to [place]. Patient has a high school education. Patient is a Scientist, water quality for [employer].   Caffeine- four daily.   Left handed.   Social Determinants of Health   Financial Resource Strain: Not on file  Food Insecurity: Not on file  Transportation Needs: Not on file  Physical Activity: Not on file  Stress: Not on file  Social Connections: Not on file    Vitals:   03/12/20 1034  BP: 120/70  Pulse: 67  Resp: 16  SpO2: 100%   Body mass index is 28.25 kg/m.  Physical Exam Vitals and nursing note reviewed.  HENT:     Head: Normocephalic and atraumatic.  Eyes:     Conjunctiva/sclera: Conjunctivae normal.  Cardiovascular:     Rate and Rhythm: Normal rate and regular rhythm.     Heart sounds: No murmur heard.   Pulmonary:     Effort: Pulmonary effort is normal. No respiratory distress.     Breath sounds: Normal breath sounds.  Musculoskeletal:     Lumbar back: No tenderness or bony tenderness.  Skin:    General: Skin is warm.     Findings: Lesion present. No rash.          Comments: No erythema. + Induration. Packing has been removed. Minimal clear drainage and sebaceous material with pressing area.   Neurological:     General: No focal deficit present.     Mental Status: He is alert and oriented to person, place, and time.   ASSESSMENT AND PLAN:  [name] was seen today for follow-up.  Diagnoses and all orders for this visit: Orders Placed This Encounter  Procedures   Ambulatory referral to General Surgery    Lumbar radicular pain Problem is well controlled. Continue Cymbalta 60 mg daily.  -     DULoxetine (CYMBALTA) 60 MG capsule; Take 1 capsule (60 mg total) by mouth daily.  Sebaceous cyst He has had recurrent episodes of infection. Appointment with surgeon will be arranged.  Back abscess Great improvement. Complete Bactrim treatment. Keep wound clean with soap and water. Instructed about warning signs.  Chronic obstructive pulmonary disease, unspecified COPD type ([place]) Problem seems to be well controlled. Continue albuterol inhaler 2 puffs every 4-6  hours as needed.  Diabetes mellitus type II, non insulin dependent ([place]) Problem is not well controlled. We discussed goals of care.  Start Metformin 850 mg twice daily with food. Glucometer sent to his pharmacy.  -     Accu-Chek Softclix Lancets lancets; Use as instructed -     Blood Glucose Monitoring Suppl (ACCU-CHEK AVIVA CONNECT) w/Device KIT; 1 Device by Does not apply route daily.  Spent 30 minutes.  During this time history was obtained and documented, examination was performed, prior lab reviewed, assessment/plan discussed, and documentation completed.  Return in about 4 months (around 07/12/2020).   Taraya Steward G. [place], MD  [facility]. [place] office.  A few things to remember from today's visit:   Sebaceous cyst - Plan: Ambulatory referral to General Surgery  Lumbar radicular pain - Plan: DULoxetine (CYMBALTA) 60 MG capsule  Back abscess  If you need refills please call your pharmacy. Do not use My Chart to request refills or for acute issues that need immediate attention.   Complete antibiotic treatment. Warm compresses. Appt with surgeon will be arranged.  Please be sure medication list is accurate. If a new problem present, please set up appointment sooner than planned today.

## 2020-03-13 ENCOUNTER — Encounter: Payer: Self-pay | Admitting: Family Medicine

## 2020-03-15 MED ORDER — GLUCOSE BLOOD VI STRP: ORAL_STRIP | 12 refills | Status: AC

## 2020-03-19 ENCOUNTER — Ambulatory Visit: Payer: Self-pay | Admitting: Surgery

## 2020-03-19 DIAGNOSIS — L723 Sebaceous cyst: Secondary | ICD-10-CM | POA: Diagnosis not present

## 2020-03-19 NOTE — H&P (Signed)
Benjamin Conner Appointment: 03/19/2020 10:10 AM Location: [facility] Patient #: [id number] DOB: 1955-06-24 Married / Language: Benjamin Conner / Race: White Male  History of Present Illness Benjamin Conner A. Benjamin Hessling MD; 03/19/2020 10:37 AM) Patient words: Patient presents for evaluation of a sebaceous cyst on his back. Set up for many years. Became infected 2 weeks ago and had been drained in his primary care's office. The patient's central back just left of midline and looks like with extension to the right. There is better today. There is no redness or drainage he doesn't think. No pain today. Patient denies any fever or chills.  The patient is a 65 year old male.   Past Surgical History Benjamin Conner, [name]; 03/19/2020 10:17 AM) Appendectomy  Diagnostic Studies History (Benjamin Conner, CMA; 03/19/2020 10:17 AM) Colonoscopy 1-5 years ago  Allergies (Benjamin Conner, CMA; 03/19/2020 10:19 AM) No Known Allergies [03/19/2020]: No Known Drug Allergies [03/19/2020]: Allergies Reconciled  Medication History (Benjamin Conner, CMA; 03/19/2020 10:21 AM) Accu-Chek Guide (w/Device Kit,) Active. Accu-Chek Softclix Lancets Active. Atorvastatin Calcium (80MG Tablet, Oral) Active. DULoxetine HCl (60MG Capsule DR Part, Oral) Active. metFORMIN HCl (850MG Tablet, Oral) Active. Oxybutynin Chloride ER (5MG Tablet ER 24HR, Oral) Active. Sulfamethoxazole-Trimethoprim (800-160MG Tablet, Oral) Active. Ventolin HFA (108 (90 Base)MCG/ACT Aerosol Soln, Inhalation) Active. NexIUM (20MG Capsule DR, Oral) Active. Oxybutynin Chloride (5MG Tablet, Oral) Active. Medications Reconciled  Social History Benjamin Conner, CMA; 03/19/2020 10:17 AM) Alcohol use Moderate alcohol use. Caffeine use Tea. Tobacco use Former smoker.  Family History Benjamin Conner, Benjamin Conner; 03/19/2020 10:17 AM) Cervical Cancer Mother. Diabetes Mellitus Brother. Heart  Disease Father. Hypertension Brother, Mother. Ovarian Cancer Mother. Respiratory Condition Father.  Other Problems Benjamin Conner, CMA; 03/19/2020 10:17 AM) Arthritis Back Pain Bladder Problems Chronic Obstructive Lung Disease Diabetes Mellitus Enlarged Prostate Gastric Ulcer Gastroesophageal Reflux Disease High blood pressure Hypercholesterolemia     Review of Systems (Benjamin Conner CMA; 03/19/2020 10:17 AM) General Present- Night Sweats. Not Present- Appetite Loss, Chills, Fatigue, Fever, Weight Gain and Weight Loss. Skin Present- New Lesions. Not Present- Change in Wart/Mole, Dryness, Hives, Jaundice, Non-Healing Wounds, Rash and Ulcer. HEENT Present- Seasonal Allergies and Wears glasses/contact lenses. Not Present- Earache, Hearing Loss, Hoarseness, Nose Bleed, Oral Ulcers, Ringing in the Ears, Sinus Pain, Sore Throat, Visual Disturbances and Yellow Eyes. Respiratory Present- Difficulty Breathing. Not Present- Bloody sputum, Chronic Cough, Snoring and Wheezing. Cardiovascular Not Present- Chest Pain, Difficulty Breathing Lying Down, Leg Cramps, Palpitations, Rapid Heart Rate, Shortness of Breath and Swelling of Extremities. Gastrointestinal Not Present- Abdominal Pain, Bloating, Bloody Stool, Change in Bowel Habits, Chronic diarrhea, Constipation, Difficulty Swallowing, Excessive gas, Gets full quickly at meals, Hemorrhoids, Indigestion, Nausea, Rectal Pain and Vomiting. Male Genitourinary Not Present- Blood in Urine, Change in Urinary Stream, Frequency, Impotence, Nocturia, Painful Urination, Urgency and Urine Leakage. Musculoskeletal Not Present- Back Pain, Joint Pain, Joint Stiffness, Muscle Pain, Muscle Weakness and Swelling of Extremities. Neurological Present- Decreased Memory. Not Present- Fainting, Headaches, Numbness, Seizures, Tingling, Tremor, Trouble walking and Weakness. Psychiatric Not Present- Anxiety, Bipolar, Change in Sleep Pattern,  Depression, Fearful and Frequent crying. Endocrine Present- New Diabetes. Not Present- Cold Intolerance, Excessive Hunger, Hair Changes, Heat Intolerance and Hot flashes. Hematology Not Present- Blood Thinners, Easy Bruising, Excessive bleeding, Gland problems, HIV and Persistent Infections.  Vitals (Benjamin Conner CMA; 03/19/2020 10:23 AM) 03/19/2020 10:21 AM Weight: 224.13 lb Height: 75in Body Surface Area: 2.3 m Body Mass Index: 28.01 kg/m  Temp.: 97.49F  Pulse: 96 (Regular)  P.OX: 99% (Room air) BP: 116/68(Sitting, Left Arm, Standard)  Physical Exam ([name]; 03/19/2020 10:38 AM)  [name] Note: 5 x 5 cm mobile mass central back nontender no redness or fluctuance small scar noted over   Chest and Lung Exam Chest and lung exam reveals -quiet, even and easy respiratory effort with no use of accessory muscles and on auscultation, normal breath sounds, no adventitious sounds and normal vocal resonance. Inspection Chest Wall - Normal. Back - normal.  Cardiovascular Cardiovascular examination reveals -normal heart sounds, regular rate and rhythm with no murmurs and normal pedal pulses bilaterally.  Neurologic Neurologic evaluation reveals -alert and oriented x 3 with no impairment of recent or remote memory. Mental Status-Normal.  Musculoskeletal Normal Exam - Left-Upper Extremity Strength Normal and Lower Extremity Strength Normal. Normal Exam - Right-Upper Extremity Strength Normal and Lower Extremity Strength Normal.    Assessment & Plan ([name]; 03/19/2020 10:39 AM)  SEBACEOUS CYST (L72.3) Impression: Central back with history of infection. Measures 5 x 5 cm is subcutaneous and is mobile. Recommend excision to prevent future infection recurrences especially since he is diabetic. Explained the risk of diabetes and poor wound healing and infection to him and strongly encouraged him to check his sugars  and take his medications. Risk of bleeding, infection, organ injury, nerve injury, blood vessel injury, recurrence, wound complications, exacerbation of underlying medical problems discussed total time 30 minutes  Current Plans Pt Education - [facility] Free Text Education/Instructions: discussed with patient and provided information. Pt Education - [facility] General Post-op HCI The pathophysiology of skin & subcutaneous masses was discussed. Natural history risks without surgery were discussed. I recommended surgery to remove the mass. I explained the technique of removal with use of local anesthesia & possible need for more aggressive sedation/anesthesia for patient comfort.  Risks such as bleeding, infection, wound breakdown, heart attack, death, and other risks were discussed. I noted a good likelihood this will help address the problem. Possibility that this will not correct all symptoms was explained. Possibility of regrowth/recurrence of the mass was discussed. We will work to minimize complications. Questions were answered. The patient expresses understanding & wishes to proceed with surgery.

## 2020-05-25 ENCOUNTER — Other Ambulatory Visit: Payer: Self-pay | Admitting: Family Medicine

## 2020-05-25 DIAGNOSIS — E119 Type 2 diabetes mellitus without complications: Secondary | ICD-10-CM

## 2020-05-31 ENCOUNTER — Other Ambulatory Visit: Payer: Self-pay | Admitting: Family Medicine

## 2020-05-31 DIAGNOSIS — I10 Essential (primary) hypertension: Secondary | ICD-10-CM

## 2020-06-17 ENCOUNTER — Other Ambulatory Visit: Payer: Self-pay

## 2020-06-17 DIAGNOSIS — I10 Essential (primary) hypertension: Secondary | ICD-10-CM

## 2020-06-17 MED ORDER — VALSARTAN-HYDROCHLOROTHIAZIDE 160-25 MG PO TABS: 1.0000 | ORAL_TABLET | Freq: Every day | ORAL | 2 refills | Status: DC

## 2020-07-12 ENCOUNTER — Ambulatory Visit: Payer: BC Managed Care – PPO | Admitting: Family Medicine

## 2020-11-02 ENCOUNTER — Other Ambulatory Visit: Payer: Self-pay | Admitting: Family Medicine

## 2020-11-02 DIAGNOSIS — R350 Frequency of micturition: Secondary | ICD-10-CM

## 2021-03-11 ENCOUNTER — Other Ambulatory Visit: Payer: Self-pay | Admitting: Family Medicine

## 2021-03-11 DIAGNOSIS — M5416 Radiculopathy, lumbar region: Secondary | ICD-10-CM

## 2021-06-30 ENCOUNTER — Ambulatory Visit (INDEPENDENT_AMBULATORY_CARE_PROVIDER_SITE_OTHER): Payer: BC Managed Care – PPO | Admitting: Family Medicine

## 2021-06-30 ENCOUNTER — Encounter: Payer: Self-pay | Admitting: Family Medicine

## 2021-06-30 VITALS — BP 136/80 | HR 74 | Temp 98.4°F | Resp 16 | Ht 75.0 in | Wt 224.0 lb

## 2021-06-30 DIAGNOSIS — Z23 Encounter for immunization: Secondary | ICD-10-CM

## 2021-06-30 DIAGNOSIS — J449 Chronic obstructive pulmonary disease, unspecified: Secondary | ICD-10-CM

## 2021-06-30 DIAGNOSIS — Z0000 Encounter for general adult medical examination without abnormal findings: Secondary | ICD-10-CM

## 2021-06-30 DIAGNOSIS — J302 Other seasonal allergic rhinitis: Secondary | ICD-10-CM

## 2021-06-30 DIAGNOSIS — E1169 Type 2 diabetes mellitus with other specified complication: Secondary | ICD-10-CM

## 2021-06-30 DIAGNOSIS — E785 Hyperlipidemia, unspecified: Secondary | ICD-10-CM

## 2021-06-30 DIAGNOSIS — E119 Type 2 diabetes mellitus without complications: Secondary | ICD-10-CM

## 2021-06-30 DIAGNOSIS — Z1211 Encounter for screening for malignant neoplasm of colon: Secondary | ICD-10-CM

## 2021-06-30 DIAGNOSIS — Z136 Encounter for screening for cardiovascular disorders: Secondary | ICD-10-CM

## 2021-06-30 DIAGNOSIS — I10 Essential (primary) hypertension: Secondary | ICD-10-CM

## 2021-06-30 DIAGNOSIS — R220 Localized swelling, mass and lump, head: Secondary | ICD-10-CM

## 2021-06-30 LAB — COMPREHENSIVE METABOLIC PANEL
ALT: 36 U/L (ref 0–53)
AST: 25 U/L (ref 0–37)
Albumin: 4.3 g/dL (ref 3.5–5.2)
Alkaline Phosphatase: 93 U/L (ref 39–117)
BUN: 14 mg/dL (ref 6–23)
CO2: 27 mEq/L (ref 19–32)
Calcium: 11.1 mg/dL — ABNORMAL HIGH (ref 8.4–10.5)
Chloride: 105 mEq/L (ref 96–112)
Creatinine, Ser: 0.78 mg/dL (ref 0.40–1.50)
GFR: 93.65 mL/min (ref >60.00)
Glucose, Bld: 86 mg/dL (ref 70–99)
Potassium: 3.8 mEq/L (ref 3.5–5.1)
Sodium: 139 mEq/L (ref 135–145)
Total Bilirubin: 0.5 mg/dL (ref 0.2–1.2)
Total Protein: 7 g/dL (ref 6.0–8.3)

## 2021-06-30 LAB — HEMOGLOBIN A1C: Hgb A1c MFr Bld: 6.6 % — ABNORMAL HIGH (ref 4.6–6.5)

## 2021-06-30 LAB — MICROALBUMIN / CREATININE URINE RATIO
Creatinine,U: 67.3 mg/dL
Microalb Creat Ratio: 2 mg/g (ref 0.0–30.0)
Microalb, Ur: 1.3 mg/dL (ref 0.0–1.9)

## 2021-06-30 LAB — LIPID PANEL
Cholesterol: 251 mg/dL — ABNORMAL HIGH (ref 0–200)
HDL: 53.7 mg/dL (ref >39.00)
Total CHOL/HDL Ratio: 5
Triglycerides: 511 mg/dL — ABNORMAL HIGH (ref 0.0–149.0)

## 2021-06-30 LAB — LDL CHOLESTEROL, DIRECT: Direct LDL: 157 mg/dL

## 2021-06-30 MED ORDER — VALSARTAN-HYDROCHLOROTHIAZIDE 160-25 MG PO TABS: 1.0000 | ORAL_TABLET | Freq: Every day | ORAL | 2 refills | Status: DC

## 2021-06-30 MED ORDER — ACCU-CHEK AVIVA PLUS W/DEVICE KIT: PACK | 0 refills | Status: DC

## 2021-06-30 MED ORDER — COMBIVENT RESPIMAT 20-100 MCG/ACT IN AERS: 1.0000 | INHALATION_SPRAY | Freq: Four times a day (QID) | RESPIRATORY_TRACT | 3 refills | Status: DC | PRN

## 2021-06-30 MED ORDER — FLUTICASONE PROPIONATE 50 MCG/ACT NA SUSP: 2.0000 | Freq: Every day | NASAL | 3 refills | Status: DC

## 2021-06-30 MED ORDER — ATORVASTATIN CALCIUM 80 MG PO TABS: 80.0000 mg | ORAL_TABLET | Freq: Every day | ORAL | 3 refills | Status: DC

## 2021-06-30 NOTE — Assessment & Plan Note (Signed)
BP re-checked 180/90 LUE. Resume valsartan-HCTZ 160-25 mg daily. Recommend monitoring BP regularly. Continue low salt/DASH diet. F/U in 3-4 months.

## 2021-06-30 NOTE — Assessment & Plan Note (Addendum)
HgA1C was not at goal. He has not been on metformin for about 4 to 6 months, further recommendation will be given according to hemoglobin A1c result. Annual eye exam, periodic dental and foot care recommended. F/U in 3-4 months

## 2021-07-03 LAB — FRUCTOSAMINE: Fructosamine: 251 umol/L (ref 205–285)

## 2021-07-15 ENCOUNTER — Other Ambulatory Visit: Payer: Self-pay

## 2021-07-15 MED ORDER — ACCU-CHEK GUIDE W/DEVICE KIT: PACK | 0 refills | Status: AC

## 2021-07-28 LAB — HM DIABETES EYE EXAM

## 2021-08-25 ENCOUNTER — Encounter: Payer: Self-pay | Admitting: Family Medicine

## 2021-09-19 ENCOUNTER — Institutional Professional Consult (permissible substitution): Payer: Medicare Other | Admitting: Plastic Surgery

## 2021-10-07 ENCOUNTER — Telehealth: Payer: Self-pay | Admitting: Plastic Surgery

## 2021-10-07 NOTE — Telephone Encounter (Signed)
LVM and My Chart message to please contact office to reschedule appt on 10/20/21 with Dr. Erin Hearing due to provider leaving practice.

## 2021-10-20 ENCOUNTER — Institutional Professional Consult (permissible substitution): Payer: BC Managed Care – PPO | Admitting: Plastic Surgery

## 2021-10-24 NOTE — Progress Notes (Unsigned)
HPI: Mr. Talan Gildner is a 66 y.o.male with medical hx significant for lumbar radiculopathy,HTN,HLD,OA,DM II,OSA,and COPD here today for his routine physical examination.  Last CPE: Has not had one in years. Last seen on 06/30/21 for follow up. The patient is active at work but does not have an exercise routine at home.  He is trying to eat healthily, cooking more at home, and consume vegetables daily.  Sleeps about 7-8 hours daily and drinks a couple of beers daily.  Immunization History  Administered Date(s) Administered   Fluad Quad(high Dose 65+) 10/27/2021   Influenza,inj,Quad PF,6+ Mos 09/14/2016, 10/26/2018, 01/24/2020   Influenza-Unspecified 10/09/2016   PFIZER(Purple Top)SARS-COV-2 Vaccination 03/31/2019, 04/25/2019, 01/24/2020   Pneumococcal Polysaccharide-23 08/25/2007, 07/17/2016, 06/30/2021   Td 06/01/2008   Zoster Recombinat (Shingrix) 10/26/2018   Health Maintenance  Topic Date Due   Medicare Annual Wellness (AWV)  Never done   Lung Cancer Screening  07/23/2015   COLONOSCOPY (Pts 45-62yr Insurance coverage will need to be confirmed)  09/23/2017   TETANUS/TDAP  06/02/2018   Zoster Vaccines- Shingrix (2 of 2) 12/21/2018   COVID-19 Vaccine (4 - Pfizer series) 03/20/2020   HEMOGLOBIN A1C  04/28/2022   Diabetic kidney evaluation - Urine ACR  07/01/2022   Pneumonia Vaccine 65 Years old (2 - PCV) 07/01/2022   FOOT EXAM  07/01/2022   OPHTHALMOLOGY EXAM  07/29/2022   Diabetic kidney evaluation - GFR measurement  10/28/2022   INFLUENZA VACCINE  Completed   Hepatitis C Screening  Completed   HIV Screening  Completed   HPV VACCINES  Aged Out   Nocturia x 2-3 , stable for years.  Former smoker. He denies experiencing cough, difficulty breathing,or chest pain.  COPD, he reports wheezing as the primary symptom and stable for years. He states that he feels better when moving around and does not experience shortness of breath with activities.  Hypercalcemia:  10.7-11.1. Negative for mood changes or changes in bowel habits.  DM II: He is on not pharmacologic treatment. Lab Results  Component Value Date   HGBA1C 6.6 (H) 06/30/2021   HLD: He has recently resumed atorvastatin 80 mg , 06/2021. Lab Results  Component Value Date    CHOL 179 03/08/2020    HDL 42.50 03/08/2020    LDLCALC 101 (H) 03/08/2020    LDLDIRECT 86.0 10/26/2018    TRIG 177.0 (H) 03/08/2020    CHOLHDL 4 03/08/2020   HTN on Valsartan-HCTZ 160-25 mg daily.  Review of Systems  Constitutional:  Negative for activity change, appetite change and fever.  HENT:  Negative for nosebleeds, sore throat, trouble swallowing and voice change.   Eyes:  Negative for redness and visual disturbance.  Respiratory:  Positive for wheezing. Negative for cough and shortness of breath.   Cardiovascular:  Negative for chest pain, palpitations and leg swelling.  Gastrointestinal:  Negative for abdominal pain, blood in stool, nausea and vomiting.  Endocrine: Negative for cold intolerance, heat intolerance, polydipsia, polyphagia and polyuria.  Genitourinary:  Negative for decreased urine volume, dysuria, genital sores, hematuria and testicular pain.  Musculoskeletal:  Positive for arthralgias and back pain. Negative for gait problem and joint swelling.  Skin:  Negative for color change and rash.  Allergic/Immunologic: Positive for environmental allergies.  Neurological:  Negative for syncope, weakness and headaches.  Hematological:  Negative for adenopathy. Does not bruise/bleed easily.  Psychiatric/Behavioral:  Negative for confusion. The patient is not nervous/anxious.   All other systems reviewed and are negative.  Current Outpatient Medications on File Prior  to Visit  Medication Sig Dispense Refill   Accu-Chek Softclix Lancets lancets Use as instructed 100 each 12   aspirin 81 MG tablet Take 81 mg by mouth daily.     atorvastatin (LIPITOR) 80 MG tablet Take 1 tablet (80 mg total) by mouth  daily. 90 tablet 3   Blood Glucose Monitoring Suppl (ACCU-CHEK GUIDE) w/Device KIT Use to check blood sugars 1-2 times daily; dx:e11.9 1 kit 0   Cholecalciferol (VITAMIN D PO) Take by mouth.     esomeprazole (NEXIUM) 20 MG packet Take 20 mg by mouth daily as needed.     finasteride (PROSCAR) 5 MG tablet Take 5 mg by mouth daily.     fluticasone (FLONASE) 50 MCG/ACT nasal spray Place 2 sprays into both nostrils daily. 48 g 3   glucose blood test strip Use to test blood sugars 1-2 times daily. 100 each 12   Ipratropium-Albuterol (COMBIVENT RESPIMAT) 20-100 MCG/ACT AERS respimat Inhale 1 puff into the lungs every 6 (six) hours as needed for wheezing. 4 g 3   oxybutynin (DITROPAN-XL) 5 MG 24 hr tablet TAKE 1 TABLET (5 MG TOTAL) BY MOUTH DAILY. 90 tablet 1   sildenafil (REVATIO) 20 MG tablet Take 1-5 tablets orally as needed     triamcinolone ointment (KENALOG) 0.5 % Apply 1 application topically 2 (two) times daily as needed. 45 g 1   valsartan-hydrochlorothiazide (DIOVAN-HCT) 160-25 MG tablet Take 1 tablet by mouth daily. 90 tablet 2   No current facility-administered medications on file prior to visit.   Past Medical History:  Diagnosis Date   BACK PAIN    chronic   COPD (chronic obstructive pulmonary disease) (HCC)    GERD    Hyperlipidemia    HYPERTENSION    HYPERTROPHY PROSTATE W/UR OBST & OTH LUTS    Nocturia    Numbness of hand    since 2005   OSTEOARTHRITIS    PEPTIC ULCER DISEASE    Personal history of colonic adenomas 09/30/2012   Pneumonia    Snoring disorder    Past Surgical History:  Procedure Laterality Date   APPENDECTOMY  1987   Allergies  Allergen Reactions   Asa [Aspirin]     Reacts as a sleeping pill   Family History  Problem Relation Age of Onset   Heart disease Father    Stroke Father    COPD Father    Coronary artery disease Mother        Stent at age 20   Hypertension Mother    High Cholesterol Mother    Cancer Mother        ovarian   Stroke  Mother    Diabetes Mother    Hypertension Brother    Diabetes Brother    Social History   Socioeconomic History   Marital status: Married    Spouse name: [name]   Number of children: 2   Years of education: 12   Highest education level: Not on file  Occupational History   Occupation: Scientist, water quality    Comment: [employer]  Tobacco Use   Smoking status: Former    Packs/day: 1.00    Years: 36.00    Total pack years: 36.00    Types: Cigarettes    Start date: 01/06/1971    Quit date: 01/06/2007    Years since quitting: 14.8   Smokeless tobacco: Never   Tobacco comments:    quit 8\2009  Substance and Sexual Activity   Alcohol use: Yes    Alcohol/week: 3.0  standard drinks of alcohol    Types: 3 Shots of liquor per week    Comment: 2-3 daily   Drug use: No   Sexual activity: Yes  Other Topics Concern   Not on file  Social History Narrative   Patient is married to Chelsea. Patient has a high school education. Patient is a Scientist, water quality for [employer].   Caffeine- four daily.   Left handed.   Social Determinants of Health   Financial Resource Strain: Not on file  Food Insecurity: Not on file  Transportation Needs: Not on file  Physical Activity: Not on file  Stress: Not on file  Social Connections: Not on file   Vitals:   10/27/21 1003  BP: 120/70  Pulse: 67  Resp: 12  Temp: 98.5 F (36.9 C)  SpO2: 97%  Body mass index is 28.51 kg/m. Wt Readings from Last 3 Encounters:  10/27/21 228 lb 2 oz (103.5 kg)  06/30/21 224 lb (101.6 kg)  03/12/20 226 lb (102.5 kg)  Physical Exam Vitals and nursing note reviewed.  Constitutional:      General: He is not in acute distress.    Appearance: He is well-developed.  HENT:     Head: Normocephalic and atraumatic.     Right Ear: Tympanic membrane, ear canal and external ear normal.     Left Ear: Tympanic membrane, ear canal and external ear normal.     Mouth/Throat:     Mouth: Mucous membranes are moist.     Pharynx: Oropharynx  is clear.  Eyes:     Extraocular Movements: Extraocular movements intact.     Conjunctiva/sclera: Conjunctivae normal.     Pupils: Pupils are equal, round, and reactive to light.  Neck:     Thyroid: No thyromegaly.     Trachea: No tracheal deviation.  Cardiovascular:     Rate and Rhythm: Normal rate and regular rhythm.     Pulses:          Dorsalis pedis pulses are 2+ on the right side and 2+ on the left side.     Heart sounds: Murmur (SEM I/VI RUSB) heard.     Comments: Trace pitting LE edema, bilateral. Pulmonary:     Effort: Pulmonary effort is normal. No respiratory distress.     Breath sounds: Normal breath sounds.  Abdominal:     Palpations: Abdomen is soft. There is no hepatomegaly or mass.     Tenderness: There is no abdominal tenderness.  Genitourinary:    Comments: No concerns. Musculoskeletal:        General: No tenderness.     Cervical back: Normal range of motion.     Comments: No signs of synovitis.  Lymphadenopathy:     Cervical: No cervical adenopathy.     Upper Body:     Right upper body: No supraclavicular adenopathy.     Left upper body: No supraclavicular adenopathy.  Skin:    General: Skin is warm.     Findings: No erythema.  Neurological:     General: No focal deficit present.     Mental Status: He is alert and oriented to person, place, and time.     Cranial Nerves: No cranial nerve deficit.     Sensory: No sensory deficit.     Gait: Gait normal.     Deep Tendon Reflexes:     Reflex Scores:      Bicep reflexes are 2+ on the right side and 2+ on the left side.  Patellar reflexes are 2+ on the right side and 2+ on the left side. Psychiatric:        Mood and Affect: Mood and affect normal.   ASSESSMENT AND PLAN:  Mr.Jarrett was seen today for annual exam.  Diagnoses and all orders for this visit: Orders Placed This Encounter  Procedures   Flu Vaccine QUAD High Dose(Fluad)   ALT   Basic metabolic panel   Hemoglobin A1c   Lipid panel    VITAMIN D 25 Hydroxy (Vit-D Deficiency, Fractures)   TSH   Parathyroid hormone, intact (no Ca)   Calcium, ionized   PSA(Must document that pt has been informed of limitations of PSA testing.)   LDL cholesterol, direct   Extra Specimen   Ambulatory Referral for Lung Cancer Scre   Lab Results  Component Value Date   HGBA1C 6.7 (H) 10/27/2021   Lab Results  Component Value Date   CREATININE 0.88 10/27/2021   BUN 18 10/27/2021   NA 139 10/27/2021   K 4.2 10/27/2021   CL 102 10/27/2021   CO2 30 10/27/2021   Lab Results  Component Value Date   ALT 42 10/27/2021   Lab Results  Component Value Date   CHOL 170 10/27/2021   HDL 48.00 10/27/2021   LDLCALC 101 (H) 03/08/2020   LDLDIRECT 82.0 10/27/2021   TRIG 375.0 (H) 10/27/2021   CHOLHDL 4 10/27/2021   Lab Results  Component Value Date   PSA 1.33 10/27/2021   Lab Results  Component Value Date   TSH 5.45 10/27/2021   Routine general medical examination at a health care facility We discussed the importance of regular physical activity and healthy diet for prevention of chronic illness and/or complications. Preventive guidelines reviewed. He had a lung cancer screening at [facility] but has not had any other screenings since then.  Colonoscopy referral placed last visit. Vaccination: Flu vaccine given, completed shingrix vaccine. He is due for Tdap, can get it at his pharmacy. Next CPE in a year.  Screening for lung cancer -     Ambulatory Referral for Lung Cancer Scre  Hypercalcemia Problem has been stable for about 5 years. We will continue following regularly.  Diabetes mellitus type II, non insulin dependent (HCC) -     Hemoglobin A1c -     Basic metabolic panel  Essential hypertension BP adequately controlled. No changes in current management.  Hyperlipidemia associated with type 2 diabetes mellitus (HCC) Continue Atorvastatin 80 mg daily and low fat diet. Further recommendations according to  lipid panel result.  Prostate cancer screening -     PSA(Must document that pt has been informed of limitations of PSA testing.)  Need for influenza vaccination -     Flu Vaccine QUAD High Dose(Fluad)  Return in 6 months (on 04/28/2022).  [name]. Martinique, MD  [facility]. [place] [facility].

## 2021-10-27 ENCOUNTER — Ambulatory Visit (INDEPENDENT_AMBULATORY_CARE_PROVIDER_SITE_OTHER): Payer: BC Managed Care – PPO | Admitting: Family Medicine

## 2021-10-27 ENCOUNTER — Encounter: Payer: Self-pay | Admitting: Family Medicine

## 2021-10-27 VITALS — BP 120/70 | HR 67 | Temp 98.5°F | Resp 12 | Ht 75.0 in | Wt 228.1 lb

## 2021-10-27 DIAGNOSIS — Z0000 Encounter for general adult medical examination without abnormal findings: Secondary | ICD-10-CM

## 2021-10-27 DIAGNOSIS — Z125 Encounter for screening for malignant neoplasm of prostate: Secondary | ICD-10-CM | POA: Diagnosis not present

## 2021-10-27 DIAGNOSIS — E785 Hyperlipidemia, unspecified: Secondary | ICD-10-CM | POA: Diagnosis not present

## 2021-10-27 DIAGNOSIS — E1169 Type 2 diabetes mellitus with other specified complication: Secondary | ICD-10-CM

## 2021-10-27 DIAGNOSIS — I10 Essential (primary) hypertension: Secondary | ICD-10-CM | POA: Diagnosis not present

## 2021-10-27 DIAGNOSIS — Z122 Encounter for screening for malignant neoplasm of respiratory organs: Secondary | ICD-10-CM

## 2021-10-27 DIAGNOSIS — Z23 Encounter for immunization: Secondary | ICD-10-CM | POA: Diagnosis not present

## 2021-10-27 DIAGNOSIS — E119 Type 2 diabetes mellitus without complications: Secondary | ICD-10-CM

## 2021-10-27 LAB — LIPID PANEL
Cholesterol: 170 mg/dL (ref 0–200)
HDL: 48 mg/dL (ref >39.00)
NonHDL: 122.13
Total CHOL/HDL Ratio: 4
Triglycerides: 375 mg/dL — ABNORMAL HIGH (ref 0.0–149.0)
VLDL: 75 mg/dL — ABNORMAL HIGH (ref 0.0–40.0)

## 2021-10-27 LAB — BASIC METABOLIC PANEL
BUN: 18 mg/dL (ref 6–23)
CO2: 30 mEq/L (ref 19–32)
Calcium: 10.9 mg/dL — ABNORMAL HIGH (ref 8.4–10.5)
Chloride: 102 mEq/L (ref 96–112)
Creatinine, Ser: 0.88 mg/dL (ref 0.40–1.50)
GFR: 90.09 mL/min (ref >60.00)
Glucose, Bld: 108 mg/dL — ABNORMAL HIGH (ref 70–99)
Potassium: 4.2 mEq/L (ref 3.5–5.1)
Sodium: 139 mEq/L (ref 135–145)

## 2021-10-27 LAB — LDL CHOLESTEROL, DIRECT: Direct LDL: 82 mg/dL

## 2021-10-27 LAB — TSH: TSH: 5.45 u[IU]/mL (ref 0.35–5.50)

## 2021-10-27 LAB — ALT: ALT: 42 U/L (ref 0–53)

## 2021-10-27 LAB — HEMOGLOBIN A1C: Hgb A1c MFr Bld: 6.7 % — ABNORMAL HIGH (ref 4.6–6.5)

## 2021-10-27 LAB — PSA: PSA: 1.33 ng/mL (ref 0.10–4.00)

## 2021-10-27 LAB — VITAMIN D 25 HYDROXY (VIT D DEFICIENCY, FRACTURES): VITD: 25.39 ng/mL — ABNORMAL LOW (ref 30.00–100.00)

## 2021-10-27 NOTE — Patient Instructions (Addendum)
A few things to remember from today's visit:   Routine general medical examination at a health care facility  Screening for lung cancer - Plan: Ambulatory Referral for Lung Cancer Scre  Hypercalcemia - Plan: Basic metabolic panel, VITAMIN D 25 Hydroxy (Vit-D Deficiency, Fractures), TSH, Parathyroid hormone, intact (no Ca), Calcium, ionized  Diabetes mellitus type II, non insulin dependent ([place]) - Plan: Basic metabolic panel, Hemoglobin A1c  Essential hypertension - Plan: TSH  Hyperlipidemia associated with type 2 diabetes mellitus ([place]) - Plan: ALT, Lipid panel  Prostate cancer screening - Plan: PSA(Must document that pt has been informed of limitations of PSA testing.)  If you need refills for medications you take chronically, please call your pharmacy. Do not use My Chart to request refills or for acute issues that need immediate attention. If you send a my chart message, it may take a few days to be addressed, specially if I am not in the office.  Please be sure medication list is accurate. If a new problem present, please set up appointment sooner than planned today.  Please try to find out which inhalers are covered by your insurance, Spiriva is another options.  Health Maintenance, Male Adopting a healthy lifestyle and getting preventive care are important in promoting health and wellness. Ask your health care provider about: The right schedule for you to have regular tests and exams. Things you can do on your own to prevent diseases and keep yourself healthy. What should I know about diet, weight, and exercise? Eat a healthy diet  Eat a diet that includes plenty of vegetables, fruits, low-fat dairy products, and lean protein. Do not eat a lot of foods that are high in solid fats, added sugars, or sodium. Maintain a healthy weight Body mass index (BMI) is a measurement that can be used to identify possible weight problems. It estimates body fat based on height and weight.  Your health care provider can help determine your BMI and help you achieve or maintain a healthy weight. Get regular exercise Get regular exercise. This is one of the most important things you can do for your health. Most adults should: Exercise for at least 150 minutes each week. The exercise should increase your heart rate and make you sweat (moderate-intensity exercise). Do strengthening exercises at least twice a week. This is in addition to the moderate-intensity exercise. Spend less time sitting. Even light physical activity can be beneficial. Watch cholesterol and blood lipids Have your blood tested for lipids and cholesterol at 65 years of age, then have this test every 5 years. You may need to have your cholesterol levels checked more often if: Your lipid or cholesterol levels are high. You are older than 65 years of age. You are at high risk for heart disease. What should I know about cancer screening? Many types of cancers can be detected early and may often be prevented. Depending on your health history and family history, you may need to have cancer screening at various ages. This may include screening for: Colorectal cancer. Prostate cancer. Skin cancer. Lung cancer. What should I know about heart disease, diabetes, and high blood pressure? Blood pressure and heart disease High blood pressure causes heart disease and increases the risk of stroke. This is more likely to develop in people who have high blood pressure readings or are overweight. Talk with your health care provider about your target blood pressure readings. Have your blood pressure checked: Every 3-5 years if you are 73-48 years of age. Every  year if you are 86 years old or older. If you are between the ages of 34 and 61 and are a current or former smoker, ask your health care provider if you should have a one-time screening for abdominal aortic aneurysm (AAA). Diabetes Have regular diabetes screenings. This  checks your fasting blood sugar level. Have the screening done: Once every three years after age 91 if you are at a normal weight and have a low risk for diabetes. More often and at a younger age if you are overweight or have a high risk for diabetes. What should I know about preventing infection? Hepatitis B If you have a higher risk for hepatitis B, you should be screened for this virus. Talk with your health care provider to find out if you are at risk for hepatitis B infection. Hepatitis C Blood testing is recommended for: Everyone born from [birth year] through 1964. Anyone with known risk factors for hepatitis C. Sexually transmitted infections (STIs) You should be screened each year for STIs, including gonorrhea and chlamydia, if: You are sexually active and are younger than 65 years of age. You are older than 65 years of age and your health care provider tells you that you are at risk for this type of infection. Your sexual activity has changed since you were last screened, and you are at increased risk for chlamydia or gonorrhea. Ask your health care provider if you are at risk. Ask your health care provider about whether you are at high risk for HIV. Your health care provider may recommend a prescription medicine to help prevent HIV infection. If you choose to take medicine to prevent HIV, you should first get tested for HIV. You should then be tested every 3 months for as long as you are taking the medicine. Follow these instructions at home: Alcohol use Do not drink alcohol if your health care provider tells you not to drink. If you drink alcohol: Limit how much you have to 0-2 drinks a day. Know how much alcohol is in your drink. In the [place] one drink equals one 12 oz bottle of beer (355 mL), one 5 oz glass of wine (148 mL), or one 1 oz glass of hard liquor (44 mL). Lifestyle Do not use any products that contain nicotine or tobacco. These products include cigarettes, chewing tobacco,  and vaping devices, such as e-cigarettes. If you need help quitting, ask your health care provider. Do not use street drugs. Do not share needles. Ask your health care provider for help if you need support or information about quitting drugs. General instructions Schedule regular health, dental, and eye exams. Stay current with your vaccines. Tell your health care provider if: You often feel depressed. You have ever been abused or do not feel safe at home. Summary Adopting a healthy lifestyle and getting preventive care are important in promoting health and wellness. Follow your health care provider's instructions about healthy diet, exercising, and getting tested or screened for diseases. Follow your health care provider's instructions on monitoring your cholesterol and blood pressure. This information is not intended to replace advice given to you by your health care provider. Make sure you discuss any questions you have with your health care provider. Document Revised: 05/13/2020 Document Reviewed: 05/13/2020 Elsevier Patient Education  [place].

## 2021-10-28 LAB — CALCIUM, IONIZED: Calcium, Ion: 5.6 mg/dL — ABNORMAL HIGH (ref 4.7–5.5)

## 2021-10-28 LAB — EXTRA SPECIMEN

## 2021-10-28 LAB — PARATHYROID HORMONE, INTACT (NO CA): PTH: 57 pg/mL (ref 16–77)

## 2022-03-11 NOTE — Progress Notes (Unsigned)
ACUTE VISIT Chief Complaint  Patient presents with   Back Pain    Center of back, ongoing for a while, starting to move down into his leg now.    HPI: [name] is a 68 y.o. male, who is here today complaining of *** Lab Results  Component Value Date   WBC 6.9 01/19/2017   HGB 14.9 01/19/2017   HCT 43.7 01/19/2017   MCV 91.5 01/19/2017   PLT 204.0 01/19/2017    Back Pain This is a chronic problem.   Review of Systems  Musculoskeletal:  Positive for back pain.  See other pertinent positives and negatives in HPI.  Current Outpatient Medications on File Prior to Visit  Medication Sig Dispense Refill   Accu-Chek Softclix Lancets lancets Use as instructed 100 each 12   aspirin 81 MG tablet Take 81 mg by mouth daily.     atorvastatin (LIPITOR) 80 MG tablet Take 1 tablet (80 mg total) by mouth daily. 90 tablet 3   Blood Glucose Monitoring Suppl (ACCU-CHEK GUIDE) w/Device KIT Use to check blood sugars 1-2 times daily; dx:e11.9 1 kit 0   Cholecalciferol (VITAMIN D PO) Take by mouth.     esomeprazole (NEXIUM) 20 MG packet Take 20 mg by mouth daily as needed.     finasteride (PROSCAR) 5 MG tablet Take 5 mg by mouth daily.     fluticasone (FLONASE) 50 MCG/ACT nasal spray Place 2 sprays into both nostrils daily. 48 g 3   glucose blood test strip Use to test blood sugars 1-2 times daily. 100 each 12   Ipratropium-Albuterol (COMBIVENT RESPIMAT) 20-100 MCG/ACT AERS respimat Inhale 1 puff into the lungs every 6 (six) hours as needed for wheezing. 4 g 3   oxybutynin (DITROPAN-XL) 5 MG 24 hr tablet TAKE 1 TABLET (5 MG TOTAL) BY MOUTH DAILY. 90 tablet 1   sildenafil (REVATIO) 20 MG tablet Take 1-5 tablets orally as needed     triamcinolone ointment (KENALOG) 0.5 % Apply 1 application topically 2 (two) times daily as needed. 45 g 1   valsartan-hydrochlorothiazide (DIOVAN-HCT) 160-25 MG tablet Take 1 tablet by mouth daily. 90 tablet 2   No current facility-administered  medications on file prior to visit.    Past Medical History:  Diagnosis Date   BACK PAIN    chronic   COPD (chronic obstructive pulmonary disease) (HCC)    GERD    Hyperlipidemia    HYPERTENSION    HYPERTROPHY PROSTATE W/UR OBST & OTH LUTS    Nocturia    Numbness of hand    since 2005   OSTEOARTHRITIS    PEPTIC ULCER DISEASE    Personal history of colonic adenomas 09/30/2012   Pneumonia    Snoring disorder    Allergies  Allergen Reactions   Asa [Aspirin]     Reacts as a sleeping pill    Social History   Socioeconomic History   Marital status: Married    Spouse name: Benjamin Conner   Number of children: 2   Years of education: 12   Highest education level: Not on file  Occupational History   Occupation: Scientist, water quality    Comment: [employer]  Tobacco Use   Smoking status: Former    Packs/day: 1.00    Years: 36.00    Total pack years: 36.00    Types: Cigarettes    Start date: 01/06/1971    Quit date: 01/06/2007    Years since quitting: 15.1   Smokeless tobacco: Never   Tobacco comments:  quit 8\2009  Substance and Sexual Activity   Alcohol use: Yes    Alcohol/week: 3.0 standard drinks of alcohol    Types: 3 Shots of liquor per week    Comment: 2-3 daily   Drug use: No   Sexual activity: Yes  Other Topics Concern   Not on file  Social History Narrative   Patient is married to Benjamin Conner. Patient has a high school education. Patient is a Scientist, water quality for [employer].   Caffeine- four daily.   Left handed.   Social Determinants of Health   Financial Resource Strain: Not on file  Food Insecurity: Not on file  Transportation Needs: Not on file  Physical Activity: Not on file  Stress: Not on file  Social Connections: Not on file    Vitals:   03/13/22 1119  BP: 126/80  Pulse: 94  Resp: 16  Temp: 97.7 F (36.5 C)  SpO2: 98%   Body mass index is 28.94 kg/m.  Physical Exam Nursing note reviewed.  Constitutional:      General: He is not in acute distress.     Appearance: He is well-developed.  HENT:     Head: Normocephalic and atraumatic.  Eyes:     Conjunctiva/sclera: Conjunctivae normal.  Cardiovascular:     Rate and Rhythm: Normal rate and regular rhythm.     Pulses:          Posterior tibial pulses are 2+ on the right side and 2+ on the left side.     Heart sounds: Murmur (SEM I/VI RUSB) heard.  Pulmonary:     Effort: Pulmonary effort is normal. No respiratory distress.     Breath sounds: Normal breath sounds.  Abdominal:     Palpations: Abdomen is soft. There is no hepatomegaly or mass.     Tenderness: There is no abdominal tenderness.  Musculoskeletal:     Lumbar back: Tenderness present. No bony tenderness. Negative right straight leg raise test and negative left straight leg raise test.  Lymphadenopathy:     Cervical: No cervical adenopathy.  Skin:    General: Skin is warm.     Findings: No erythema or rash.  Neurological:     Mental Status: He is alert and oriented to person, place, and time.     Cranial Nerves: No cranial nerve deficit.     Gait: Gait normal.  Psychiatric:        Mood and Affect: Mood and affect normal.     ASSESSMENT AND PLAN: Night sweats -     CBC; Future -     Basic metabolic panel; Future -     C-reactive protein; Future  Chronic bilateral low back pain with right-sided sciatica -     DG Lumbar Spine Complete; Future -     predniSONE; 2 tabs for 4 days, 1 tabs for 3 days, and 1/2 tab for 3 days. Take tables together with breakfast.  Dispense: 20 tablet; Refill: 0 -     Celecoxib; Take 1 capsule (100 mg total) by mouth 2 (two) times daily for 10 days.  Dispense: 20 capsule; Refill: 0  Screening for AAA (aortic abdominal aneurysm) -     VAS [place] MEDICARE SCREEN; Future    Return if symptoms worsen or fail to improve, for keep next appointment.  Dorothe Elmore G. [place], MD  [facility]. [place] office.  Discharge Instructions   None

## 2022-03-13 ENCOUNTER — Telehealth (HOSPITAL_BASED_OUTPATIENT_CLINIC_OR_DEPARTMENT_OTHER): Payer: Self-pay | Admitting: *Deleted

## 2022-03-13 ENCOUNTER — Encounter: Payer: Self-pay | Admitting: Family Medicine

## 2022-03-13 ENCOUNTER — Ambulatory Visit: Payer: BC Managed Care – PPO | Admitting: Family Medicine

## 2022-03-13 VITALS — BP 126/80 | HR 94 | Temp 97.7°F | Resp 16 | Ht 75.0 in | Wt 231.5 lb

## 2022-03-13 DIAGNOSIS — I10 Essential (primary) hypertension: Secondary | ICD-10-CM | POA: Diagnosis not present

## 2022-03-13 DIAGNOSIS — R61 Generalized hyperhidrosis: Secondary | ICD-10-CM

## 2022-03-13 DIAGNOSIS — Z136 Encounter for screening for cardiovascular disorders: Secondary | ICD-10-CM | POA: Diagnosis not present

## 2022-03-13 DIAGNOSIS — J449 Chronic obstructive pulmonary disease, unspecified: Secondary | ICD-10-CM

## 2022-03-13 DIAGNOSIS — G8929 Other chronic pain: Secondary | ICD-10-CM

## 2022-03-13 DIAGNOSIS — M5441 Lumbago with sciatica, right side: Secondary | ICD-10-CM

## 2022-03-13 LAB — BASIC METABOLIC PANEL
BUN: 23 mg/dL (ref 6–23)
CO2: 24 mEq/L (ref 19–32)
Calcium: 11 mg/dL — ABNORMAL HIGH (ref 8.4–10.5)
Chloride: 102 mEq/L (ref 96–112)
Creatinine, Ser: 0.92 mg/dL (ref 0.40–1.50)
GFR: 86.93 mL/min (ref >60.00)
Glucose, Bld: 201 mg/dL — ABNORMAL HIGH (ref 70–99)
Potassium: 4.3 mEq/L (ref 3.5–5.1)
Sodium: 135 mEq/L (ref 135–145)

## 2022-03-13 LAB — CBC
HCT: 46.7 % (ref 39.0–52.0)
Hemoglobin: 16.5 g/dL (ref 13.0–17.0)
MCHC: 35.5 g/dL (ref 30.0–36.0)
MCV: 88.4 fl (ref 78.0–100.0)
Platelets: 192 10*3/uL (ref 150.0–400.0)
RBC: 5.28 Mil/uL (ref 4.22–5.81)
RDW: 12.9 % (ref 11.5–15.5)
WBC: 8.1 10*3/uL (ref 4.0–10.5)

## 2022-03-13 LAB — C-REACTIVE PROTEIN: CRP: <1 mg/dL (ref 0.5–20.0)

## 2022-03-13 MED ORDER — CELECOXIB 100 MG PO CAPS: 100.0000 mg | ORAL_CAPSULE | Freq: Two times a day (BID) | ORAL | 0 refills | Status: AC

## 2022-03-13 MED ORDER — PREDNISONE 20 MG PO TABS: ORAL_TABLET | ORAL | 0 refills | Status: DC

## 2022-03-13 NOTE — Telephone Encounter (Signed)
Left message for patient to call and discuss scheduling the Aorta medicare screening ordered by Dr. Betty Martinique

## 2022-03-13 NOTE — Patient Instructions (Addendum)
A few things to remember from today's visit:  Night sweats - Plan: CBC, Basic metabolic panel, C-reactive protein  Chronic bilateral low back pain with right-sided sciatica - Plan: DG Lumbar Spine Complete, predniSONE (DELTASONE) 20 MG tablet, celecoxib (CELEBREX) 100 MG capsule  Screening for AAA (aortic abdominal aneurysm) - Plan: VAS [place] AORTA MEDICARE SCREEN  Stop Aleve. You can take Tylenol. Take Prednisone with breakfast and no with another medication. Celebrex 100 mg 2 times daily for 7-10 days. If leg pain is not resolved in 4-6 weeks we may need a back MRI.  If you need refills for medications you take chronically, please call your pharmacy. Do not use My Chart to request refills or for acute issues that need immediate attention. If you send a my chart message, it may take a few days to be addressed, specially if I am not in the office.  Please be sure medication list is accurate. If a new problem present, please set up appointment sooner than planned today.

## 2022-03-14 NOTE — Assessment & Plan Note (Signed)
BP adequately controlled. Continue valsartan-HCTZ 160-25 mg daily. Monitor BP regularly. Continue low salt/DASH diet.

## 2022-03-14 NOTE — Assessment & Plan Note (Signed)
Problem is stable. Continue Combivent respimat 20-100 mg 1 puff qid prn.

## 2022-03-17 ENCOUNTER — Ambulatory Visit (INDEPENDENT_AMBULATORY_CARE_PROVIDER_SITE_OTHER)
Admission: RE | Admit: 2022-03-17 | Discharge: 2022-03-17 | Disposition: A | Discharge status: Home / Self Care | Payer: BC Managed Care – PPO | Source: Ambulatory Visit | Attending: Family Medicine | Admitting: Family Medicine

## 2022-03-17 DIAGNOSIS — M5441 Lumbago with sciatica, right side: Secondary | ICD-10-CM | POA: Diagnosis not present

## 2022-03-17 DIAGNOSIS — G8929 Other chronic pain: Secondary | ICD-10-CM | POA: Diagnosis not present

## 2022-03-17 DIAGNOSIS — M545 Low back pain, unspecified: Secondary | ICD-10-CM | POA: Diagnosis not present

## 2022-03-19 DIAGNOSIS — E119 Type 2 diabetes mellitus without complications: Secondary | ICD-10-CM | POA: Diagnosis not present

## 2022-03-20 NOTE — Telephone Encounter (Signed)
Left message for patient to call and discuss scheduling the VAS [place] MEDICARE SCREEN ordered by Dr. Betty Martinique

## 2022-03-26 ENCOUNTER — Encounter (HOSPITAL_BASED_OUTPATIENT_CLINIC_OR_DEPARTMENT_OTHER): Payer: Self-pay | Admitting: *Deleted

## 2022-03-26 NOTE — Telephone Encounter (Signed)
Unable to reach patient via phone.  Will mail letter requesting patient call office to schedule.

## 2022-05-11 NOTE — Progress Notes (Unsigned)
HPI: [name] is a 67 y.o. male, who is here today for chronic disease management.  Last seen on 03/13/22 for back pain. Lower back pain has improved some, no new associated symptoms. He is requesting another referral for abdominal aortic aneurysm screening, states that he could not be reached, so a new referral needs to be placed. He was also referred for lung cancer screening, according to records, a letter was sent to his arteries because he could not be reached.  COPD: He could not for Combivent Respimat, still having dyspnea with exertion. No associated cough or wheezing. Former smoker.  Diabetes Mellitus II: Dx'ed in 2017 He is on nonpharmacologic treatment. He does not exercise regularly but he has an active job. Eye exam: 07/2021. Foot exam: 06/2021 Negative for symptoms of hypoglycemia, foot ulcers/trauma Polyuria and polydipsia, both he attributes to treatment with prednisone, 03/13/2022, which were recommended to treat lumbar radiculopathy. Last HgA1C was 6.7 in 10/2021.  Lab Results  Component Value Date   MICROALBUR 1.3 06/30/2021   Hypertension: Currently he is on valsartan-HCTZ 160-25 mg daily. He checks his BP occasionally and has noticed some high BPs, he does not remember readings. Negative for unusual or severe headache, visual changes, focal weakness, or edema. For the past few days he has had episodes of mid stabbing CP, not radiated, sometimes associated with dyspnea and diaphoresis that lasted a few minutes.  Episodes happen with exertion but a few times he has had them when he is resting.    Lab Results  Component Value Date   CREATININE 0.92 03/13/2022   BUN 23 03/13/2022   NA 135 03/13/2022   K 4.3 03/13/2022   CL 102 03/13/2022   CO2 24 03/13/2022   He is also reporting changes in bowel habits over the past month, alternating between diarrhea and constipation.  Denies any dietary change. He has not noted blood in the stool or  melena.  Review of Systems  Constitutional:  Positive for appetite change (Decreased in the past month or so.). Negative for chills and fever.  HENT:  Negative for mouth sores, sore throat and trouble swallowing.   Gastrointestinal:  Negative for abdominal pain, nausea and vomiting.  Endocrine: Negative for cold intolerance and heat intolerance.  Genitourinary:  Negative for decreased urine volume, dysuria and hematuria.  Musculoskeletal:  Positive for back pain.  Skin:  Negative for rash.  Neurological:  Negative for syncope and facial asymmetry.  See other pertinent positives and negatives in HPI.  Current Outpatient Medications on File Prior to Visit  Medication Sig Dispense Refill   Accu-Chek Softclix Lancets lancets Use as instructed 100 each 12   aspirin 81 MG tablet Take 81 mg by mouth daily.     atorvastatin (LIPITOR) 80 MG tablet Take 1 tablet (80 mg total) by mouth daily. 90 tablet 3   Blood Glucose Monitoring Suppl (ACCU-CHEK GUIDE) w/Device KIT Use to check blood sugars 1-2 times daily; dx:e11.9 1 kit 0   Cholecalciferol (VITAMIN D PO) Take by mouth.     esomeprazole (NEXIUM) 20 MG packet Take 20 mg by mouth daily as needed.     finasteride (PROSCAR) 5 MG tablet Take 5 mg by mouth daily.     fluticasone (FLONASE) 50 MCG/ACT nasal spray Place 2 sprays into both nostrils daily. 48 g 3   glucose blood test strip Use to test blood sugars 1-2 times daily. 100 each 12   Ipratropium-Albuterol (COMBIVENT RESPIMAT) 20-100 MCG/ACT AERS respimat Inhale 1  puff into the lungs every 6 (six) hours as needed for wheezing. 4 g 3   oxybutynin (DITROPAN-XL) 5 MG 24 hr tablet TAKE 1 TABLET (5 MG TOTAL) BY MOUTH DAILY. 90 tablet 1   sildenafil (REVATIO) 20 MG tablet Take 1-5 tablets orally as needed     triamcinolone ointment (KENALOG) 0.5 % Apply 1 application topically 2 (two) times daily as needed. 45 g 1   valsartan-hydrochlorothiazide (DIOVAN-HCT) 160-25 MG tablet Take 1 tablet by mouth  daily. 90 tablet 2   No current facility-administered medications on file prior to visit.   Past Medical History:  Diagnosis Date   BACK PAIN    chronic   COPD (chronic obstructive pulmonary disease) (HCC)    GERD    Hyperlipidemia    HYPERTENSION    HYPERTROPHY PROSTATE W/UR OBST & OTH LUTS    Nocturia    Numbness of hand    since 2005   OSTEOARTHRITIS    PEPTIC ULCER DISEASE    Personal history of colonic adenomas 09/30/2012   Pneumonia    Snoring disorder    Allergies  Allergen Reactions   Asa [Aspirin]     Reacts as a sleeping pill    Social History   Socioeconomic History   Marital status: Married    Spouse name: Benjamin Conner   Number of children: 2   Years of education: 12   Highest education level: Not on file  Occupational History   Occupation: Conservation officer, nature    Comment: [employer]  Tobacco Use   Smoking status: Former    Packs/day: 1.00    Years: 36.00    Additional pack years: 0.00    Total pack years: 36.00    Types: Cigarettes    Start date: 01/06/1971    Quit date: 01/06/2007    Years since quitting: 15.3   Smokeless tobacco: Never   Tobacco comments:    quit 8\2009  Substance and Sexual Activity   Alcohol use: Yes    Alcohol/week: 3.0 standard drinks of alcohol    Types: 3 Shots of liquor per week    Comment: 2-3 daily   Drug use: No   Sexual activity: Yes  Other Topics Concern   Not on file  Social History Narrative   Patient is married to Benjamin Conner. Patient has a high school education. Patient is a Conservation officer, nature for [employer].   Caffeine- four daily.   Left handed.   Social Determinants of Health   Financial Resource Strain: Not on file  Food Insecurity: Not on file  Transportation Needs: Not on file  Physical Activity: Not on file  Stress: Not on file  Social Connections: Not on file   Vitals:   05/12/22 1129  BP: 136/74  Pulse: 80  Resp: 16  Temp: (!) 97.5 F (36.4 C)  SpO2: 95%   Body mass index is 27.37 kg/m.  Physical  Exam Vitals and nursing note reviewed.  Constitutional:      General: He is not in acute distress.    Appearance: He is well-developed.  HENT:     Head: Normocephalic and atraumatic.     Mouth/Throat:     Mouth: Mucous membranes are moist.  Eyes:     Conjunctiva/sclera: Conjunctivae normal.  Cardiovascular:     Rate and Rhythm: Normal rate and regular rhythm.     Pulses:          Dorsalis pedis pulses are 2+ on the right side and 2+ on the left side.  Heart sounds: Murmur (SEM I/VI LUSB) heard.  Pulmonary:     Effort: Pulmonary effort is normal. No respiratory distress.     Breath sounds: Normal breath sounds.  Abdominal:     Palpations: Abdomen is soft. There is no hepatomegaly or mass.     Tenderness: There is no abdominal tenderness.  Lymphadenopathy:     Cervical: No cervical adenopathy.  Skin:    General: Skin is warm.     Findings: No erythema or rash.  Neurological:     Mental Status: He is alert and oriented to person, place, and time.     Cranial Nerves: No cranial nerve deficit.     Gait: Gait normal.  Psychiatric:        Mood and Affect: Mood and affect normal.   ASSESSMENT AND PLAN:  Mr. Jonluke was seen today for medical management of chronic issues.  Diagnoses and all orders for this visit: Lab Results  Component Value Date   HGBA1C 10.0 (A) 05/12/2022   SOB (shortness of breath) Assessment & Plan: We discussed possible etiologies. Some of his chronic medical problems could be contributing factors,?  COPD. Because of sometimes associated CP and diaphoresis, cardiac etiology needs to be considered. Instructed about warning signs.   Diabetes mellitus type II, non insulin dependent (HCC) Assessment & Plan: Comorbidities: Hypertension, COPD, and hyperlipidemia. Problem is not well-controlled. In the past he has taken metformin with no side effects, recommend empagliflozin-metformin 5-500 mg twice daily with meals. Monitor BS. Continue regular eye  exams and appropriate footcare. Follow-up in 4 months.  Orders: -     POCT glycosylated hemoglobin (Hb A1C) -     Empagliflozin-metFORMIN HCl; Take 1 tablet by mouth 2 (two) times daily with a meal.  Dispense: 180 tablet; Refill: 1  Essential hypertension Assessment & Plan: Reporting some elevated BP's at home. Continue valsartan-HCTZ 160-25 mg daily. Today we added metoprolol succinate 25 mg, recommend starting 1/2 tablet for 1 week and then increase to 1 tablet daily. Monitor BP regularly. Continue low-salt diet. Eye exam is current.  Orders: -     Metoprolol Succinate ER; Take 1 tablet (25 mg total) by mouth daily.  Dispense: 90 tablet; Refill: 0  Chest pain, unspecified type Assessment & Plan: Reporting recent episodes of CP with associated dyspnea and diaphoresis, not always with exertion.  Last episode was yesterday, he is not having any CP today. He has multiple CV risk factors. EKG today: NSR, LAD,, normal intervals, no signs of acute ischemia.  No significant differences with prior EKGs. Clearly instructed about warning signs. Appointment with cardiology will be arranged.  Orders: -     Ambulatory referral to Cardiology -     EKG 12-Lead  Colon cancer screening -     Ambulatory referral to Gastroenterology  Type 2 diabetes mellitus with other specified complication, without long-term current use of insulin (HCC) Assessment & Plan: Comorbidities: Hypertension, COPD, and hyperlipidemia. Problem is not well-controlled. In the past he has taken metformin with no side effects, recommend empagliflozin-metformin 5-500 mg twice daily with meals. Monitor BS. Continue regular eye exams and appropriate footcare. Follow-up in 4 months.   Screening for AAA (abdominal aortic aneurysm) -     [place] MEDICARE SCREENING; Future  Chronic obstructive pulmonary disease, unspecified COPD type (HCC) Assessment & Plan: Dyspnea he is reporting can definitely be associated with this  problem, no associated cough or wheezing. He could not afford Combivent. For now we will hold on adding another inhaler  and taking cardiac workup is completed.   I spent a total of 43 minutes in both face to face and non face to face activities (excluding the EKG time) for this visit on the date of this encounter. During this time history was obtained and documented, examination was performed, prior labs/imaging reviewed, and assessment/plan discussed.  Letters mailed to him from pulmonologist office and radiologist for lung cancer screening and abdominal aortic aneurysm screening respectively were printed and given to him, so he can call and arrange appointment. He is due for colonoscopy, referral to GI placed.  Return in about 4 months (around 09/12/2022) for chronic problems.  Latisia Hilaire G. [place], MD  [facility]. [place] office.

## 2022-05-12 ENCOUNTER — Ambulatory Visit: Payer: BC Managed Care – PPO | Admitting: Family Medicine

## 2022-05-12 ENCOUNTER — Encounter: Payer: Self-pay | Admitting: Family Medicine

## 2022-05-12 VITALS — BP 136/74 | HR 80 | Temp 97.5°F | Resp 16 | Ht 75.0 in | Wt 219.0 lb

## 2022-05-12 DIAGNOSIS — R0602 Shortness of breath: Secondary | ICD-10-CM

## 2022-05-12 DIAGNOSIS — E1169 Type 2 diabetes mellitus with other specified complication: Secondary | ICD-10-CM | POA: Diagnosis not present

## 2022-05-12 DIAGNOSIS — I10 Essential (primary) hypertension: Secondary | ICD-10-CM | POA: Diagnosis not present

## 2022-05-12 DIAGNOSIS — Z7984 Long term (current) use of oral hypoglycemic drugs: Secondary | ICD-10-CM

## 2022-05-12 DIAGNOSIS — J449 Chronic obstructive pulmonary disease, unspecified: Secondary | ICD-10-CM

## 2022-05-12 DIAGNOSIS — Z1211 Encounter for screening for malignant neoplasm of colon: Secondary | ICD-10-CM

## 2022-05-12 DIAGNOSIS — E119 Type 2 diabetes mellitus without complications: Secondary | ICD-10-CM

## 2022-05-12 DIAGNOSIS — Z136 Encounter for screening for cardiovascular disorders: Secondary | ICD-10-CM

## 2022-05-12 DIAGNOSIS — R079 Chest pain, unspecified: Secondary | ICD-10-CM

## 2022-05-12 LAB — POCT GLYCOSYLATED HEMOGLOBIN (HGB A1C): Hemoglobin A1C: 10 % — AB (ref 4.0–5.6)

## 2022-05-12 MED ORDER — EMPAGLIFLOZIN-METFORMIN HCL 5-500 MG PO TABS
1.0000 | ORAL_TABLET | Freq: Two times a day (BID) | ORAL | 1 refills | Status: DC
Start: 2022-05-12 — End: 2022-06-24

## 2022-05-12 MED ORDER — METOPROLOL SUCCINATE ER 25 MG PO TB24
25.0000 mg | ORAL_TABLET | Freq: Every day | ORAL | 0 refills | Status: DC
Start: 2022-05-12 — End: 2022-09-15

## 2022-05-12 NOTE — Assessment & Plan Note (Signed)
Dyspnea he is reporting can definitely be associated with this problem, no associated cough or wheezing. He could not afford Combivent. For now we will hold on adding another inhaler and taking cardiac workup is completed.

## 2022-05-12 NOTE — Assessment & Plan Note (Signed)
Reporting some elevated BP's at home. Continue valsartan-HCTZ 160-25 mg daily. Today we added metoprolol succinate 25 mg, recommend starting 1/2 tablet for 1 week and then increase to 1 tablet daily. Monitor BP regularly. Continue low-salt diet. Eye exam is current.

## 2022-05-12 NOTE — Patient Instructions (Addendum)
A few things to remember from today's visit:  Diabetes mellitus type II, non insulin dependent (HCC) - Plan: POC HgB A1c, Empagliflozin-metFORMIN HCl 5-500 MG TABS  Essential hypertension - Plan: metoprolol succinate (TOPROL-XL) 25 MG 24 hr tablet  Hyperlipidemia associated with type 2 diabetes mellitus (HCC)  Screening for AAA (abdominal aortic aneurysm) - Plan: [place]ORTA MEDICARE SCREENING  Chest pain, unspecified type - Plan: Ambulatory referral to Cardiology, EKG 12-Lead  Colon cancer screening - Plan: Ambulatory referral to Gastroenterology  Metoprolol succinate 25 mg added today, start 1/2 tab for a week then 1 tab daily. Empagliflozin-metformin with meals for sugar. No changes in the rest of medications. Appt with cardiologist will be arranged. Please contact radiology to schedule your abdominal aortic aneurysm screening and pulmonologist for lung cancer screening.  If you need refills for medications you take chronically, please call your pharmacy. Do not use My Chart to request refills or for acute issues that need immediate attention. If you send a my chart message, it may take a few days to be addressed, specially if I am not in the office.  Please be sure medication list is accurate. If a new problem present, please set up appointment sooner than planned today.

## 2022-05-12 NOTE — Assessment & Plan Note (Signed)
Comorbidities: Hypertension, COPD, and hyperlipidemia. Problem is not well-controlled. In the past he has taken metformin with no side effects, recommend empagliflozin-metformin 5-500 mg twice daily with meals. Monitor BS. Continue regular eye exams and appropriate footcare. Follow-up in 4 months.

## 2022-05-12 NOTE — Assessment & Plan Note (Signed)
Reporting recent episodes of CP with associated dyspnea and diaphoresis, not always with exertion.  Last episode was yesterday, he is not having any CP today. He has multiple CV risk factors. EKG today: NSR, LAD,, normal intervals, no signs of acute ischemia.  No significant differences with prior EKGs. Clearly instructed about warning signs. Appointment with cardiology will be arranged.

## 2022-05-12 NOTE — Assessment & Plan Note (Signed)
We discussed possible etiologies. Some of his chronic medical problems could be contributing factors,?  COPD. Because of sometimes associated CP and diaphoresis, cardiac etiology needs to be considered. Instructed about warning signs.

## 2022-05-19 ENCOUNTER — Ambulatory Visit (HOSPITAL_BASED_OUTPATIENT_CLINIC_OR_DEPARTMENT_OTHER)
Admission: RE | Admit: 2022-05-19 | Discharge: 2022-05-19 | Disposition: A | Discharge status: Home / Self Care | Payer: BC Managed Care – PPO | Source: Ambulatory Visit | Attending: Family Medicine | Admitting: Family Medicine

## 2022-05-19 DIAGNOSIS — Z136 Encounter for screening for cardiovascular disorders: Secondary | ICD-10-CM | POA: Diagnosis not present

## 2022-05-19 DIAGNOSIS — Z87891 Personal history of nicotine dependence: Secondary | ICD-10-CM | POA: Diagnosis not present

## 2022-06-14 ENCOUNTER — Encounter: Payer: Self-pay | Admitting: Family Medicine

## 2022-06-24 ENCOUNTER — Encounter: Payer: Self-pay | Admitting: Family Medicine

## 2022-06-24 ENCOUNTER — Other Ambulatory Visit: Payer: Self-pay | Admitting: Family Medicine

## 2022-06-24 DIAGNOSIS — E1169 Type 2 diabetes mellitus with other specified complication: Secondary | ICD-10-CM

## 2022-06-24 MED ORDER — EMPAGLIFLOZIN 10 MG PO TABS: 10.0000 mg | ORAL_TABLET | Freq: Every day | ORAL | 1 refills | Status: DC

## 2022-06-24 MED ORDER — METFORMIN HCL 500 MG PO TABS
500.0000 mg | ORAL_TABLET | Freq: Two times a day (BID) | ORAL | 1 refills | Status: DC
Start: 2022-06-24 — End: 2023-05-03

## 2022-06-24 NOTE — Telephone Encounter (Signed)
I tried to do the PA on Jardiance, but they're saying it's covered already without one.

## 2022-07-07 ENCOUNTER — Ambulatory Visit: Payer: BC Managed Care – PPO | Attending: Interventional Cardiology | Admitting: Interventional Cardiology

## 2022-07-07 ENCOUNTER — Encounter: Payer: Self-pay | Admitting: Interventional Cardiology

## 2022-07-07 VITALS — BP 118/60 | HR 65 | Ht 75.0 in | Wt 218.4 lb

## 2022-07-07 DIAGNOSIS — E119 Type 2 diabetes mellitus without complications: Secondary | ICD-10-CM

## 2022-07-07 DIAGNOSIS — Z87891 Personal history of nicotine dependence: Secondary | ICD-10-CM

## 2022-07-07 DIAGNOSIS — E782 Mixed hyperlipidemia: Secondary | ICD-10-CM

## 2022-07-07 DIAGNOSIS — R072 Precordial pain: Secondary | ICD-10-CM

## 2022-07-07 DIAGNOSIS — I10 Essential (primary) hypertension: Secondary | ICD-10-CM

## 2022-07-07 DIAGNOSIS — Z7984 Long term (current) use of oral hypoglycemic drugs: Secondary | ICD-10-CM

## 2022-07-07 NOTE — Progress Notes (Signed)
Cardiology Office Note   Date:  07/07/2022   ID:  Benjamin Conner 01-Feb-1955, MRN [id number]  PCP:  [place], [name], MD    No chief complaint on file.  Chest pain  Wt Readings from Last 3 Encounters:  07/07/22 218 lb 6.4 oz (99.1 kg)  05/12/22 219 lb (99.3 kg)  03/13/22 231 lb 8 oz (105 kg)       History of Present Illness: Benjamin Conner is a 67 y.o. male with a history of hypertension, diabetes COPD and prior tobacco abuse, who is being seen today for the evaluation of chest pain at the request of [place], [name], MD.   2013 echo and ETT were unremarkable.   Records from primary care doctor show: "For the past few days he has had episodes of mid stabbing CP, not radiated, sometimes associated with dyspnea and diaphoresis that lasted a few minutes.  Episodes happen with exertion but a few times he has had them when he is resting."  He reports that they happen when he is hot and sweaty.  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 He thinks COPD plays a part as well.  Sx worse in hot weather. Sx have been going on for 5 months. Sx are getting better over the past 5 months. Less frequent.  Now down to every few weeks.  Previously, sx were nearly daily.   Father had some issues bradycardia.  Died from Emphysema. Brother with DM.   Denies : Chest pain. Dizziness. Leg edema. Nitroglycerin use. Orthopnea. Palpitations. Paroxysmal nocturnal dyspnea. Shortness of breath. Syncope.    Past Medical History:  Diagnosis Date   BACK PAIN    chronic   COPD (chronic obstructive pulmonary disease) (HCC)    GERD    Hyperlipidemia    HYPERTENSION    HYPERTROPHY PROSTATE W/UR OBST & OTH LUTS    Nocturia    Numbness of hand    since 2005   OSTEOARTHRITIS    PEPTIC ULCER DISEASE    Personal history of colonic adenomas 09/30/2012   Pneumonia    Snoring disorder     Past Surgical History:  Procedure Laterality Date    APPENDECTOMY  1987     Current Outpatient Medications  Medication Sig Dispense Refill   Accu-Chek Softclix Lancets lancets Use as instructed 100 each 12   aspirin 81 MG tablet Take 81 mg by mouth daily.     atorvastatin (LIPITOR) 80 MG tablet Take 1 tablet (80 mg total) by mouth daily. 90 tablet 3   Blood Glucose Monitoring Suppl (ACCU-CHEK GUIDE) w/Device KIT Use to check blood sugars 1-2 times daily; dx:e11.9 1 kit 0   Cholecalciferol (VITAMIN D PO) Take by mouth.     esomeprazole (NEXIUM) 20 MG packet Take 20 mg by mouth daily as needed.     finasteride (PROSCAR) 5 MG tablet Take 5 mg by mouth daily.     fluticasone (FLONASE) 50 MCG/ACT nasal spray Place 2 sprays into both nostrils daily. 48 Conner 3   glucose blood test strip Use to test blood sugars 1-2 times daily. 100 each 12   Ipratropium-Albuterol (COMBIVENT RESPIMAT) 20-100 MCG/ACT AERS respimat Inhale 1 puff into the lungs every 6 (six) hours as needed for wheezing. 4 Conner 3   metFORMIN (GLUCOPHAGE) 500 MG tablet Take 1 tablet (500 mg total) by mouth 2 (two) times daily with a meal. 180 tablet 1  metoprolol succinate (TOPROL-XL) 25 MG 24 hr tablet Take 1 tablet (25 mg total) by mouth daily. 90 tablet 0   triamcinolone ointment (KENALOG) 0.5 % Apply 1 application topically 2 (two) times daily as needed. 45 Conner 1   valsartan-hydrochlorothiazide (DIOVAN-HCT) 160-25 MG tablet Take 1 tablet by mouth daily. 90 tablet 2   empagliflozin (JARDIANCE) 10 MG TABS tablet Take 1 tablet (10 mg total) by mouth daily before breakfast. (Patient not taking: Reported on 07/07/2022) 90 tablet 1   No current facility-administered medications for this visit.    Allergies:   Asa [aspirin]    Social History:  The patient  reports that he quit smoking about 15 years ago. His smoking use included cigarettes. He started smoking about 51 years ago. He has a 36.00 pack-year smoking history. He has never used smokeless tobacco. He reports current alcohol use of about  3.0 standard drinks of alcohol per week. He reports that he does not use drugs.   Family History:  The patient's family history includes COPD in his father; Cancer in his mother; Coronary artery disease in his mother; Diabetes in his brother and mother; Heart disease in his father; High Cholesterol in his mother; Hypertension in his brother and mother; Stroke in his father and mother.    ROS:  Please see the history of present illness.   Otherwise, review of systems are positive for DOE.   All other systems are reviewed and negative.    PHYSICAL EXAM: VS:  BP 118/60   Pulse 65   Ht 6\' 3"  (1.905 m)   Wt 218 lb 6.4 oz (99.1 kg)   SpO2 97%   BMI 27.30 kg/m  , BMI Body mass index is 27.3 kg/m. GEN: Well nourished, well developed, in no acute distress HEENT: normal Neck: no JVD, carotid bruits, or masses Cardiac: RRR; no murmurs, rubs, or gallops,no edema  Respiratory:  clear to auscultation bilaterally, normal work of breathing GI: soft, nontender, nondistended, + BS MS: no deformity or atrophy Skin: warm and dry, no rash Neuro:  Strength and sensation are intact Psych: euthymic mood, full affect   EKG:   The ekg ordered today demonstrates NSR, no ST changes   Recent Labs: 10/27/2021: ALT 42; TSH 5.45 03/13/2022: BUN 23; Creatinine, Ser 0.92; Hemoglobin 16.5; Platelets 192.0; Potassium 4.3; Sodium 135   Lipid Panel    Component Value Date/Time   CHOL 170 10/27/2021 1045   TRIG 375.0 (H) 10/27/2021 1045   HDL 48.00 10/27/2021 1045   CHOLHDL 4 10/27/2021 1045   VLDL 75.0 (H) 10/27/2021 1045   LDLCALC 101 (H) 03/08/2020 1204   LDLDIRECT 82.0 10/27/2021 1045     Other studies Reviewed: Additional studies/ records that were reviewed today with results demonstrating: labs reviewed.   ASSESSMENT AND PLAN:  Chest pain: Some typical and some atypical features of the chest pain.  We discussed CTA coronaries and cardiac catheterization.  I think since his symptoms are getting  less frequent, we will plan for CTA coronaries.  We did discuss that if there was severe disease, he would need a cardiac catheterization.  Hopefully, he can be managed medically since his symptoms are improving.  Since the symptoms are not gone, I do think testing is warranted.  Heart rate 65 bpm at rest.  Will have him double his metoprolol dose the day of the CT scan. Hypertension: The current medical regimen is effective;  continue present plan and medications.   Hyperlipidemia: October 2023 total cholesterol 170  HDL 48 LDL 101 triglycerides 375.  Cholesterol readings are higher than we would want for someone with diabetes Diabetes: May 2024 A1c 10.0.  Whole food plant-based diet.  High-fiber diet.  Avoid processed foods.  Cutting back on sugar intake.   Former smoker/COPD: Uses inhaler.   Current medicines are reviewed at length with the patient today.  The patient concerns regarding his medicines were addressed.  The following changes have been made:  No change  Labs/ tests ordered today include: BMet, CTA   Orders Placed This Encounter  Procedures   EKG 12-Lead    Recommend 150 minutes/week of aerobic exercise Low fat, low carb, high fiber diet recommended  Disposition:   FU 1 year, sooner based on test result   Signed, Lance Muss, MD  07/07/2022 1:21 PM    [facility] [address] [place], [place], [place]  [address] Phone: [phone]; Fax: [phone]

## 2022-07-07 NOTE — Patient Instructions (Addendum)
Medication Instructions:  Your physician recommends that you continue on your current medications as directed. Please refer to the Current Medication list given to you today.  *If you need a refill on your cardiac medications before your next appointment, please call your pharmacy*   Lab Work: Lab work to be done today--BMP If you have labs (blood work) drawn today and your tests are completely normal, you will receive your results only by: MyChart Message (if you have MyChart) OR A paper copy in the mail If you have any lab test that is abnormal or we need to change your treatment, we will call you to review the results.   Testing/Procedures: Your physician has requested that you have cardiac CT. Cardiac computed tomography (CT) is a painless test that uses an x-ray machine to take clear, detailed pictures of your heart. For further information please visit [email or web address] Please follow instruction sheet as given.     Follow-Up: At [facility], you and your health needs are our priority.  As part of our continuing mission to provide you with exceptional heart care, we have created designated Provider Care Teams.  These Care Teams include your primary Cardiologist (physician) and Advanced Practice Providers (APPs -  Physician Assistants and Nurse Practitioners) who all work together to provide you with the care you need, when you need it.  We recommend signing up for the patient portal called "MyChart".  Sign up information is provided on this After Visit Summary.  MyChart is used to connect with patients for Virtual Visits (Telemedicine).  Patients are able to view lab/test results, encounter notes, upcoming appointments, etc.  Non-urgent messages can be sent to your provider as well.   To learn more about what you can do with MyChart, go to [email or web address].    Your next appointment:   Based on results  Provider:   Lance Muss, MD     Other Instructions     Your cardiac CT will be scheduled at one of the below locations:   [facility] [address] [place], [place] [address] [phone]  OR  [facility] [address] [place], [place] [address] [phone]  OR   [facility] [address] [place], [place] [address] [phone]  If scheduled at [facility], please arrive at the [facility] and Children's Entrance (Entrance C2) of [facility] 30 minutes prior to test start time. You can use the FREE valet parking offered at entrance C (encouraged to control the heart rate for the test)  Proceed to the [facility] Radiology Department (first floor) to check-in and test prep.  All radiology patients and guests should use entrance C2 at [facility], accessed from [facility], even though the hospital's physical address listed is [address]    If scheduled at [facility] or [facility], please arrive 15 mins early for check-in and test prep.   Please follow these instructions carefully (unless otherwise directed):  An IV will be required for this test and Nitroglycerin will be given.  Hold all erectile dysfunction medications at least 3 days (72 hrs) prior to test. (Ie viagra, cialis, sildenafil, tadalafil, etc)   On the Night Before the Test: Be sure to Drink plenty of water. Do not consume any caffeinated/decaffeinated beverages or chocolate 12 hours prior to your test. Do not take any antihistamines 12 hours prior to your test.  On the Day of the Test: Drink plenty of water until 1 hour prior to the test. Do not eat any food 1 hour prior to test. You may take your regular medications prior to the test.  Take 2 of your 25 mg Metoprolol succinate tablets 2 hours prior to the test  (this will be your morning dose) If you take  Furosemide/Hydrochlorothiazide/Spironolactone, please HOLD on the morning of the test.--Do not take valsartan-hydrochlorothiazide the morning of the test         After the Test: Drink plenty of water. After receiving IV contrast, you may experience a mild flushed feeling. This is normal. On occasion, you may experience a mild rash up to 24 hours after the test. This is not dangerous. If this occurs, you can take Benadryl 25 mg and increase your fluid intake. If you experience trouble breathing, this can be serious. If it is severe call 911 IMMEDIATELY. If it is mild, please call our office. If you take any of these medications: Glipizide/Metformin, Avandament, Glucavance, please do not take 48 hours after completing test unless otherwise instructed.  We will call to schedule your test 2-4 weeks out understanding that some insurance companies will need an authorization prior to the service being performed.   For more information and frequently asked questions, please visit our website : [email or web address]  For non-scheduling related questions, please contact the cardiac imaging nurse navigator should you have any questions/concerns: Rockwell Alexandria, Cardiac Imaging Nurse Navigator Larey Brick, Cardiac Imaging Nurse Navigator  Heart and Vascular Services Direct Office Dial: [phone]   For scheduling needs, including cancellations and rescheduling, please call [place], [phone].

## 2022-07-08 LAB — BASIC METABOLIC PANEL
BUN/Creatinine Ratio: 21 (ref 10–24)
BUN: 17 mg/dL (ref 8–27)
CO2: 23 mmol/L (ref 20–29)
Calcium: 10.8 mg/dL — ABNORMAL HIGH (ref 8.6–10.2)
Chloride: 102 mmol/L (ref 96–106)
Creatinine, Ser: 0.82 mg/dL (ref 0.76–1.27)
Glucose: 165 mg/dL — ABNORMAL HIGH (ref 70–99)
Potassium: 4.4 mmol/L (ref 3.5–5.2)
Sodium: 139 mmol/L (ref 134–144)
eGFR: 97 mL/min/{1.73_m2} (ref >59)

## 2022-07-15 ENCOUNTER — Telehealth (HOSPITAL_COMMUNITY): Payer: Self-pay | Admitting: *Deleted

## 2022-07-15 NOTE — Telephone Encounter (Signed)
Attempted to call patient regarding upcoming cardiac CT appointment. °Left message on voicemail with name and callback number ° °Ruthel Martine RN Navigator Cardiac Imaging ° Heart and Vascular Services °[phone] Office °[phone] Cell ° °

## 2022-07-16 ENCOUNTER — Ambulatory Visit (HOSPITAL_COMMUNITY)
Admission: RE | Admit: 2022-07-16 | Discharge: 2022-07-16 | Disposition: A | Discharge status: Home / Self Care | Payer: BC Managed Care – PPO | Source: Ambulatory Visit | Attending: Interventional Cardiology | Admitting: Interventional Cardiology

## 2022-07-16 DIAGNOSIS — R072 Precordial pain: Secondary | ICD-10-CM

## 2022-07-16 MED ORDER — METOPROLOL TARTRATE 5 MG/5ML IV SOLN
INTRAVENOUS | Status: AC
  Filled 2022-07-16: qty 5

## 2022-07-16 MED ORDER — IOHEXOL 350 MG/ML SOLN
95.0000 mL | Freq: Once | INTRAVENOUS | Status: AC | PRN
  Administered 2022-07-16: 95 mL via INTRAVENOUS

## 2022-07-16 MED ORDER — NITROGLYCERIN 0.4 MG SL SUBL
SUBLINGUAL_TABLET | SUBLINGUAL | Status: AC
  Filled 2022-07-16: qty 2

## 2022-07-16 MED ORDER — NITROGLYCERIN 0.4 MG SL SUBL
0.8000 mg | SUBLINGUAL_TABLET | Freq: Once | SUBLINGUAL | Status: AC
  Administered 2022-07-16: 0.8 mg via SUBLINGUAL

## 2022-07-16 MED ORDER — METOPROLOL TARTRATE 5 MG/5ML IV SOLN
5.0000 mg | INTRAVENOUS | Status: AC | PRN
  Administered 2022-07-16: 5 mg via INTRAVENOUS

## 2022-07-23 ENCOUNTER — Other Ambulatory Visit: Payer: Self-pay | Admitting: Family Medicine

## 2022-07-23 DIAGNOSIS — E1169 Type 2 diabetes mellitus with other specified complication: Secondary | ICD-10-CM

## 2022-07-23 DIAGNOSIS — I10 Essential (primary) hypertension: Secondary | ICD-10-CM

## 2022-08-05 ENCOUNTER — Encounter: Payer: Self-pay | Admitting: Internal Medicine

## 2022-08-16 ENCOUNTER — Other Ambulatory Visit: Payer: Self-pay | Admitting: Family Medicine

## 2022-08-16 DIAGNOSIS — J302 Other seasonal allergic rhinitis: Secondary | ICD-10-CM

## 2022-08-18 ENCOUNTER — Ambulatory Visit (AMBULATORY_SURGERY_CENTER): Payer: Medicare Other

## 2022-08-18 VITALS — Ht 75.0 in | Wt 218.0 lb

## 2022-08-18 DIAGNOSIS — Z86010 Personal history of colonic polyps: Secondary | ICD-10-CM

## 2022-08-18 MED ORDER — PEG 3350-KCL-NA BICARB-NACL 420 G PO SOLR: 4000.0000 mL | Freq: Once | ORAL | 0 refills | Status: AC

## 2022-08-18 NOTE — Progress Notes (Signed)
No egg or soy allergy known to patient   No issues known to pt with past sedation with any surgeries or procedures  Patient denies ever being told they had issues or difficulty with intubation   No FH of Malignant Hyperthermia  Pt is not on diet pills  Pt is not on  home 02   Pt is not on blood thinners   Pt denies issues with constipation   No A fib or A flutter  Have any cardiac testing pending--no    Patient's chart reviewed by Cathlyn Parsons CRNA prior to previsit and patient appropriate for the [facility].  Previsit completed and red dot placed by patient's name on their procedure day (on provider's schedule).

## 2022-08-20 ENCOUNTER — Encounter (INDEPENDENT_AMBULATORY_CARE_PROVIDER_SITE_OTHER): Payer: Self-pay

## 2022-09-03 ENCOUNTER — Ambulatory Visit (AMBULATORY_SURGERY_CENTER): Payer: Medicare Other | Admitting: Internal Medicine

## 2022-09-03 ENCOUNTER — Encounter: Payer: Self-pay | Admitting: Internal Medicine

## 2022-09-03 VITALS — BP 115/79 | HR 61 | Temp 98.0°F | Resp 11 | Ht 75.0 in | Wt 218.0 lb

## 2022-09-03 DIAGNOSIS — D120 Benign neoplasm of cecum: Secondary | ICD-10-CM

## 2022-09-03 DIAGNOSIS — Z86010 Personal history of colonic polyps: Secondary | ICD-10-CM

## 2022-09-03 DIAGNOSIS — Z09 Encounter for follow-up examination after completed treatment for conditions other than malignant neoplasm: Secondary | ICD-10-CM | POA: Diagnosis not present

## 2022-09-03 MED ORDER — SODIUM CHLORIDE 0.9 % IV SOLN: 500.0000 mL | Freq: Once | INTRAVENOUS | Status: DC

## 2022-09-03 NOTE — Patient Instructions (Addendum)
Three tiny polyps found and removed.  You also have a condition called diverticulosis - common and not usually a problem. Please read the handout provided.  I will let you know pathology results and when to have another routine colonoscopy by mail and/or My Chart.  I appreciate the opportunity to care for you. Iva Boop, MD, FACG  YOU HAD AN ENDOSCOPIC PROCEDURE TODAY AT THE [facility]:   Refer to the procedure report that was given to you for any specific questions about what was found during the examination.  If the procedure report does not answer your questions, please call your gastroenterologist to clarify.  If you requested that your care partner not be given the details of your procedure findings, then the procedure report has been included in a sealed envelope for you to review at your convenience later.  YOU SHOULD EXPECT: Some feelings of bloating in the abdomen. Passage of more gas than usual.  Walking can help get rid of the air that was put into your GI tract during the procedure and reduce the bloating. If you had a lower endoscopy (such as a colonoscopy or flexible sigmoidoscopy) you may notice spotting of blood in your stool or on the toilet paper. If you underwent a bowel prep for your procedure, you may not have a normal bowel movement for a few days.  Please Note:  You might notice some irritation and congestion in your nose or some drainage.  This is from the oxygen used during your procedure.  There is no need for concern and it should clear up in a day or so.  SYMPTOMS TO REPORT IMMEDIATELY:  Following lower endoscopy (colonoscopy or flexible sigmoidoscopy):  Excessive amounts of blood in the stool  Significant tenderness or worsening of abdominal pains  Swelling of the abdomen that is new, acute  Fever of 100F or higher  For urgent or emergent issues, a gastroenterologist can be reached at any hour by calling [phone]. Do not use MyChart  messaging for urgent concerns.    DIET:  We do recommend a small meal at first, but then you may proceed to your regular diet.  Drink plenty of fluids but you should avoid alcoholic beverages for 24 hours.  ACTIVITY:  You should plan to take it easy for the rest of today and you should NOT DRIVE or use heavy machinery until tomorrow (because of the sedation medicines used during the test).    FOLLOW UP: Our staff will call the number listed on your records the next business day following your procedure.  We will call around 7:15- 8:00 am to check on you and address any questions or concerns that you may have regarding the information given to you following your procedure. If we do not reach you, we will leave a message.     If any biopsies were taken you will be contacted by phone or by letter within the next 1-3 weeks.  Please call us at [phone] if you have not heard about the biopsies in 3 weeks.    SIGNATURES/CONFIDENTIALITY: You and/or your care partner have signed paperwork which will be entered into your electronic medical record.  These signatures attest to the fact that that the information above on your After Visit Summary has been reviewed and is understood.  Full responsibility of the confidentiality of this discharge information lies with you and/or your care-partner.

## 2022-09-03 NOTE — Op Note (Signed)
[facility] Patient Name: Benjamin Conner Procedure Date: 09/03/2022 7:21 AM MRN: [id number] Endoscopist: Iva Boop , MD, [id number] Age: 67 Referring MD:  Date of Birth: 09-17-1955 Gender: Male Account #: [id number] Procedure:                Colonoscopy Indications:              Surveillance: Personal history of adenomatous                            polyps on last colonoscopy > 5 years ago, Last                            colonoscopy: 2014 Medicines:                Monitored Anesthesia Care Procedure:                Pre-Anesthesia Assessment:                           - Prior to the procedure, a History and Physical                            was performed, and patient medications and                            allergies were reviewed. The patient's tolerance of                            previous anesthesia was also reviewed. The risks                            and benefits of the procedure and the sedation                            options and risks were discussed with the patient.                            All questions were answered, and informed consent                            was obtained. Prior Anticoagulants: The patient has                            taken no anticoagulant or antiplatelet agents. ASA                            Grade Assessment: II - A patient with mild systemic                            disease. After reviewing the risks and benefits,                            the patient was deemed in satisfactory condition to  undergo the procedure.                           After obtaining informed consent, the colonoscope                            was passed under direct vision. Throughout the                            procedure, the patient's blood pressure, pulse, and                            oxygen saturations were monitored continuously. The                            CF [id number] #[id number] was introduced through the  anus                            and advanced to the the cecum, identified by                            appendiceal orifice and ileocecal valve. The                            colonoscopy was performed without difficulty. The                            patient tolerated the procedure well. The quality                            of the bowel preparation was good. The ileocecal                            valve, appendiceal orifice, and rectum were                            photographed. The bowel preparation used was                            GoLYTELY via split dose instruction. Scope In: 8:09:20 AM Scope Out: 8:27:10 AM Scope Withdrawal Time: 0 hours 14 minutes 35 seconds  Total Procedure Duration: 0 hours 17 minutes 50 seconds  Findings:                 The perianal and digital rectal examinations were                            normal. Pertinent negatives include normal prostate                            (size, shape, and consistency).                           Three sessile polyps were found in the cecum. The  polyps were diminutive in size. These polyps were                            removed with a cold snare. Resection and retrieval                            were complete. Verification of patient                            identification for the specimen was done. Estimated                            blood loss was minimal.                           Multiple diverticula were found in the sigmoid                            colon and ascending colon.                           The exam was otherwise without abnormality on                            direct and retroflexion views. Complications:            No immediate complications. Estimated Blood Loss:     Estimated blood loss was minimal. Impression:               - Three diminutive polyps in the cecum, removed                            with a cold snare. Resected and retrieved.                            - Diverticulosis in the sigmoid colon and in the                            ascending colon.                           - The examination was otherwise normal on direct                            and retroflexion views.                           - Personal history of colonic polyps. 08/30/2006 -                            tub adenoma                           09/2012 - 2 adenomas max 6 mm Recommendation:           - Patient has a contact number available for  emergencies. The signs and symptoms of potential                            delayed complications were discussed with the                            patient. Return to normal activities tomorrow.                            Written discharge instructions were provided to the                            patient.                           - Resume previous diet.                           - Continue present medications.                           - Repeat colonoscopy is recommended for                            surveillance. The colonoscopy date will be                            determined after pathology results from today's                            exam become available for review. Iva Boop, MD 09/03/2022 8:35:17 AM This report has been signed electronically.

## 2022-09-03 NOTE — Progress Notes (Signed)
Pt's states no medical or surgical changes since previsit or office visit. 

## 2022-09-03 NOTE — Progress Notes (Signed)
[facility] History and Physical   Primary Care Physician:  [place], [name], MD   Reason for Procedure:    Encounter Diagnosis  Name Primary?   Personal history of colonic polyps Yes     Plan:    colonoscopy     HPI: Benjamin Conner is a 67 y.o. male   08/30/2006 - tub adenoma Randa Evens) 09/2012 - 2 adenomas max 6 mm - repeat colon 2019  Past Medical History:  Diagnosis Date   BACK PAIN    chronic   COPD (chronic obstructive pulmonary disease) (HCC)    Diabetes mellitus without complication (HCC)    GERD    Heart murmur    Hyperlipidemia    HYPERTENSION    HYPERTROPHY PROSTATE W/UR OBST & OTH LUTS    Nocturia    Numbness of hand    since 2005   OSTEOARTHRITIS    PEPTIC ULCER DISEASE    Personal history of colonic adenomas 09/30/2012   Pneumonia    Sleep apnea    Snoring disorder     Past Surgical History:  Procedure Laterality Date   APPENDECTOMY  01/05/1985   COLONOSCOPY  2014    Prior to Admission medications   Medication Sig Start Date End Date Taking? Authorizing Provider  Accu-Chek Softclix Lancets lancets Use as instructed 03/12/20  Yes [place], [name], MD  aspirin 81 MG tablet Take 81 mg by mouth daily.   Yes [provider]  atorvastatin (LIPITOR) 80 MG tablet Take 1 tablet by mouth once daily 07/24/22  Yes [place], [name], MD  Blood Glucose Monitoring Suppl (ACCU-CHEK GUIDE) w/Device KIT Use to check blood sugars 1-2 times daily; dx:e11.9 07/15/21  Yes [place], [name], MD  Cholecalciferol (VITAMIN D PO) Take by mouth.   Yes [provider]  esomeprazole (NEXIUM) 20 MG packet Take 20 mg by mouth daily as needed.   Yes [provider]  fluticasone (FLONASE) 50 MCG/ACT nasal spray Use 2 spray(s) in each nostril once daily 08/18/22  Yes [place], [name], MD  glucose blood test strip Use to test blood sugars 1-2 times daily. 03/15/20  Yes [place], [name], MD  Ipratropium-Albuterol (COMBIVENT RESPIMAT) 20-100 MCG/ACT  AERS respimat Inhale 1 puff into the lungs every 6 (six) hours as needed for wheezing. 06/30/21  Yes [place], [name], MD  metFORMIN (GLUCOPHAGE) 500 MG tablet Take 1 tablet (500 mg total) by mouth 2 (two) times daily with a meal. 06/24/22  Yes [place], [name], MD  metoprolol succinate (TOPROL-XL) 25 MG 24 hr tablet Take 1 tablet (25 mg total) by mouth daily. 05/12/22  Yes [place], [name], MD  Misc Natural Products (MENS PROSTATE HEALTH FORMULA PO) Take by mouth.   Yes [provider]  valsartan-hydrochlorothiazide (DIOVAN-HCT) 160-25 MG tablet Take 1 tablet by mouth once daily 07/24/22  Yes [place], [name], MD  empagliflozin (JARDIANCE) 10 MG TABS tablet Take 1 tablet (10 mg total) by mouth daily before breakfast. Patient not taking: Reported on 09/03/2022 06/24/22   [place], [name], MD  finasteride (PROSCAR) 5 MG tablet Take 5 mg by mouth daily. Patient not taking: Reported on 09/03/2022    Malen Gauze, MD  triamcinolone ointment (KENALOG) 0.5 % Apply 1 application topically 2 (two) times daily as needed. Patient not taking: Reported on 08/18/2022 04/07/17   [place], [name], MD    Current Outpatient Medications  Medication Sig Dispense Refill   Accu-Chek Softclix Lancets lancets Use as instructed 100 each  12   aspirin 81 MG tablet Take 81 mg by mouth daily.     atorvastatin (LIPITOR) 80 MG tablet Take 1 tablet by mouth once daily 90 tablet 0   Blood Glucose Monitoring Suppl (ACCU-CHEK GUIDE) w/Device KIT Use to check blood sugars 1-2 times daily; dx:e11.9 1 kit 0   Cholecalciferol (VITAMIN D PO) Take by mouth.     esomeprazole (NEXIUM) 20 MG packet Take 20 mg by mouth daily as needed.     fluticasone (FLONASE) 50 MCG/ACT nasal spray Use 2 spray(s) in each nostril once daily 48 g 0   glucose blood test strip Use to test blood sugars 1-2 times daily. 100 each 12   Ipratropium-Albuterol (COMBIVENT RESPIMAT) 20-100 MCG/ACT AERS respimat Inhale 1 puff into the lungs every 6 (six) hours as  needed for wheezing. 4 g 3   metFORMIN (GLUCOPHAGE) 500 MG tablet Take 1 tablet (500 mg total) by mouth 2 (two) times daily with a meal. 180 tablet 1   metoprolol succinate (TOPROL-XL) 25 MG 24 hr tablet Take 1 tablet (25 mg total) by mouth daily. 90 tablet 0   Misc Natural Products (MENS PROSTATE HEALTH FORMULA PO) Take by mouth.     valsartan-hydrochlorothiazide (DIOVAN-HCT) 160-25 MG tablet Take 1 tablet by mouth once daily 90 tablet 0   empagliflozin (JARDIANCE) 10 MG TABS tablet Take 1 tablet (10 mg total) by mouth daily before breakfast. (Patient not taking: Reported on 09/03/2022) 90 tablet 1   finasteride (PROSCAR) 5 MG tablet Take 5 mg by mouth daily. (Patient not taking: Reported on 09/03/2022)     triamcinolone ointment (KENALOG) 0.5 % Apply 1 application topically 2 (two) times daily as needed. (Patient not taking: Reported on 08/18/2022) 45 g 1   Current Facility-Administered Medications  Medication Dose Route Frequency Provider Last Rate Last Admin   0.9 %  sodium chloride infusion  500 mL Intravenous Once Iva Boop, MD        Allergies as of 09/03/2022 - Review Complete 09/03/2022  Allergen Reaction Noted   Asa [aspirin]  05/27/2011    Family History  Problem Relation Age of Onset   Coronary artery disease Mother        Stent at age 48   Hypertension Mother    High Cholesterol Mother    Cancer Mother        ovarian   Stroke Mother    Diabetes Mother    Heart disease Father    Stroke Father    COPD Father    Hypertension Brother    Diabetes Brother    Colon cancer Neg Hx    Colon polyps Neg Hx    Esophageal cancer Neg Hx    Stomach cancer Neg Hx    Rectal cancer Neg Hx     Social History   Socioeconomic History   Marital status: Married    Spouse name: [name]   Number of children: 2   Years of education: 12   Highest education level: Not on file  Occupational History   Occupation: Conservation officer, nature    Comment: [employer]  Tobacco Use   Smoking status:  Former    Current packs/day: 0.00    Average packs/day: 1 pack/day for 36.0 years (36.0 ttl pk-yrs)    Types: Cigarettes    Start date: 01/06/1971    Quit date: 01/06/2007    Years since quitting: 15.6   Smokeless tobacco: Never   Tobacco comments:    quit 8\2009  Vaping Use  Vaping status: Never Used  Substance and Sexual Activity   Alcohol use: Yes    Alcohol/week: 3.0 standard drinks of alcohol    Types: 3 Shots of liquor per week    Comment: 2-3 daily   Drug use: Never   Sexual activity: Yes  Other Topics Concern   Not on file  Social History Narrative   Patient is married to [place]. Patient has a high school education. Patient is a Conservation officer, nature for [employer].   Caffeine- four daily.   Left handed.   Social Determinants of Health   Financial Resource Strain: Not on file  Food Insecurity: Not on file  Transportation Needs: Not on file  Physical Activity: Not on file  Stress: Not on file  Social Connections: Unknown (05/20/2021)   Received from [facility]   Social Network    Social Network: Not on file  Intimate Partner Violence: Unknown (04/11/2021)   Received from [facility]   HITS    Physically Hurt: Not on file    Insult or Talk Down To: Not on file    Threaten Physical Harm: Not on file    Scream or Curse: Not on file    Review of Systems:  All other review of systems negative except as mentioned in the HPI.  Physical Exam: Vital signs BP (!) 158/87   Pulse 80   Temp 98 F (36.7 C) (Temporal)   Resp 20   Ht 6\' 3"  (1.905 m)   Wt 218 lb (98.9 kg)   SpO2 98%   BMI 27.25 kg/m   General:   Alert,  Well-developed, well-nourished, pleasant and cooperative in NAD Lungs:  Clear throughout to auscultation.   Heart:  Regular rate and rhythm; no murmurs, clicks, rubs,  or gallops. Abdomen:  Soft, nontender and nondistended. Normal bowel sounds.   Neuro/Psych:  Alert and cooperative. Normal mood and affect. A and O x 3   @Patty Leitzke  Sena Slate, MD,  [facility] [phone] (pager) 09/03/2022 8:02 AM@

## 2022-09-03 NOTE — Progress Notes (Signed)
Sedate, gd SR, tolerated procedure well, VSS, report to RN 

## 2022-09-03 NOTE — Progress Notes (Signed)
Called to room to assist during endoscopic procedure.  Patient ID and intended procedure confirmed with present staff. Received instructions for my participation in the procedure from the performing physician.  

## 2022-09-04 ENCOUNTER — Telehealth: Payer: Self-pay

## 2022-09-04 NOTE — Telephone Encounter (Signed)
Post procedure follow up call, no answer 

## 2022-09-08 ENCOUNTER — Encounter: Payer: Self-pay | Admitting: Internal Medicine

## 2022-09-08 DIAGNOSIS — Z86010 Personal history of colonic polyps: Secondary | ICD-10-CM

## 2022-09-14 NOTE — Progress Notes (Unsigned)
HPI: Benjamin Conner is a 67 y.o. male, who is here today for chronic disease management.  Last seen on 05/12/22  Diabetes Mellitus II:  - Checking BG at home: *** - Medications:  Metformin 500 mg bid  - Compliance: *** - Diet: *** - Exercise: *** - eye exam: *** - foot exam: *** - microalbumin: *** - Negative for symptoms of hypoglycemia, polyuria, polydipsia, numbness extremities, foot ulcers/trauma  Lab Results  Component Value Date   HGBA1C 10.0 (A) 05/12/2022   Lab Results  Component Value Date   MICROALBUR 1.3 06/30/2021   Hypertension:  Medications: Metoprolol Succinate 25 mg daily and Valsartan-Hydrochlorothiazide 160-25 mg daily. BP readings at home:*** Side effects:***  Negative for unusual or severe headache, visual changes, exertional chest pain, dyspnea,  focal weakness, or edema.  Lab Results  Component Value Date   CREATININE 0.82 07/07/2022   BUN 17 07/07/2022   NA 139 07/07/2022   K 4.4 07/07/2022   CL 102 07/07/2022   CO2 23 07/07/2022     Hyperlipidemia: Currently on Atorvastatin 80 mg daily. Following a low fat diet: ***. Side effects from medication: None Lab Results  Component Value Date   CHOL 170 10/27/2021   HDL 48.00 10/27/2021   LDLCALC 101 (H) 03/08/2020   LDLDIRECT 82.0 10/27/2021   TRIG 375.0 (H) 10/27/2021   CHOLHDL 4 10/27/2021      Review of Systems See other pertinent positives and negatives in HPI.  Current Outpatient Medications on File Prior to Visit  Medication Sig Dispense Refill   Accu-Chek Softclix Lancets lancets Use as instructed 100 each 12   aspirin 81 MG tablet Take 81 mg by mouth daily.     atorvastatin (LIPITOR) 80 MG tablet Take 1 tablet by mouth once daily 90 tablet 0   Blood Glucose Monitoring Suppl (ACCU-CHEK GUIDE) w/Device KIT Use to check blood sugars 1-2 times daily; dx:e11.9 1 kit 0   Cholecalciferol (VITAMIN D PO) Take by mouth.     esomeprazole (NEXIUM) 20 MG packet Take 20 mg  by mouth daily as needed.     fluticasone (FLONASE) 50 MCG/ACT nasal spray Use 2 spray(s) in each nostril once daily 48 g 0   glucose blood test strip Use to test blood sugars 1-2 times daily. 100 each 12   Ipratropium-Albuterol (COMBIVENT RESPIMAT) 20-100 MCG/ACT AERS respimat Inhale 1 puff into the lungs every 6 (six) hours as needed for wheezing. 4 g 3   metFORMIN (GLUCOPHAGE) 500 MG tablet Take 1 tablet (500 mg total) by mouth 2 (two) times daily with a meal. 180 tablet 1   metoprolol succinate (TOPROL-XL) 25 MG 24 hr tablet Take 1 tablet (25 mg total) by mouth daily. 90 tablet 0   Misc Natural Products (MENS PROSTATE HEALTH FORMULA PO) Take by mouth.     valsartan-hydrochlorothiazide (DIOVAN-HCT) 160-25 MG tablet Take 1 tablet by mouth once daily 90 tablet 0   No current facility-administered medications on file prior to visit.    Past Medical History:  Diagnosis Date   BACK PAIN    chronic   COPD (chronic obstructive pulmonary disease) (HCC)    Diabetes mellitus without complication (HCC)    GERD    Heart murmur    Hyperlipidemia    HYPERTENSION    HYPERTROPHY PROSTATE W/UR OBST & OTH LUTS    Nocturia    Numbness of hand    since 2005   OSTEOARTHRITIS    PEPTIC ULCER DISEASE  Personal history of colonic adenomas 09/30/2012   Pneumonia    Sleep apnea    Snoring disorder    Allergies  Allergen Reactions   Asa [Aspirin]     Reacts as a sleeping pill    Social History   Socioeconomic History   Marital status: Married    Spouse name: [name]   Number of children: 2   Years of education: 12   Highest education level: GED or equivalent  Occupational History   Occupation: Conservation officer, nature    Comment: [employer]  Tobacco Use   Smoking status: Former    Current packs/day: 0.00    Average packs/day: 1 pack/day for 36.0 years (36.0 ttl pk-yrs)    Types: Cigarettes    Start date: 01/06/1971    Quit date: 01/06/2007    Years since quitting: 15.7   Smokeless tobacco: Never    Tobacco comments:    quit 8\2009  Vaping Use   Vaping status: Never Used  Substance and Sexual Activity   Alcohol use: Yes    Alcohol/week: 3.0 standard drinks of alcohol    Types: 3 Shots of liquor per week    Comment: 2-3 daily   Drug use: Never   Sexual activity: Yes  Other Topics Concern   Not on file  Social History Narrative   Patient is married to [place]. Patient has a high school education. Patient is a Conservation officer, nature for [employer].   Caffeine- four daily.   Left handed.   Social Determinants of Health   Financial Resource Strain: Medium Risk (09/14/2022)   Overall Financial Resource Strain (CARDIA)    Difficulty of Paying Living Expenses: Somewhat hard  Food Insecurity: No Food Insecurity (09/14/2022)   Hunger Vital Sign    Worried About Running Out of Food in the Last Year: Never true    Ran Out of Food in the Last Year: Never true  Transportation Needs: No Transportation Needs (09/14/2022)   PRAPARE - Administrator, Civil Service (Medical): No    Lack of Transportation (Non-Medical): No  Physical Activity: Sufficiently Active (09/14/2022)   Exercise Vital Sign    Days of Exercise per Week: 3 days    Minutes of Exercise per Session: 150+ min  Stress: Stress Concern Present (09/14/2022)   [employer] of Occupational Health - Occupational Stress Questionnaire    Feeling of Stress : To some extent  Social Connections: Socially Isolated (09/14/2022)   Social Connection and Isolation Panel [NHANES]    Frequency of Communication with Friends and Family: Never    Frequency of Social Gatherings with Friends and Family: Never    Attends Religious Services: Never    Database administrator or Organizations: No    Attends Engineer, structural: Not on file    Marital Status: Married    Vitals:   09/15/22 1353  BP: 122/70  Pulse: 60  Temp: 98.3 F (36.8 C)  SpO2: 98%   Body mass index is 27.75 kg/m.  Physical Exam  ASSESSMENT AND  PLAN:  Benjamin Conner was seen today for medical management of chronic issues.  Diagnoses and all orders for this visit:  Type 2 diabetes mellitus with other specified complication, without long-term current use of insulin (HCC) -     POC HgB A1c    Orders Placed This Encounter  Procedures   POC HgB A1c    No problem-specific Assessment & Plan notes found for this encounter.   No follow-ups on file.  Timoteo Expose  [place], MD  [facility]. [place] office.

## 2022-09-15 ENCOUNTER — Other Ambulatory Visit: Payer: Self-pay | Admitting: Family Medicine

## 2022-09-15 ENCOUNTER — Encounter: Payer: Self-pay | Admitting: Family Medicine

## 2022-09-15 ENCOUNTER — Ambulatory Visit (INDEPENDENT_AMBULATORY_CARE_PROVIDER_SITE_OTHER): Payer: Medicare Other | Admitting: Family Medicine

## 2022-09-15 VITALS — BP 122/70 | HR 60 | Temp 98.3°F | Resp 16 | Ht 75.0 in | Wt 222.0 lb

## 2022-09-15 DIAGNOSIS — M5441 Lumbago with sciatica, right side: Secondary | ICD-10-CM | POA: Diagnosis not present

## 2022-09-15 DIAGNOSIS — I10 Essential (primary) hypertension: Secondary | ICD-10-CM

## 2022-09-15 DIAGNOSIS — Z7984 Long term (current) use of oral hypoglycemic drugs: Secondary | ICD-10-CM

## 2022-09-15 DIAGNOSIS — J449 Chronic obstructive pulmonary disease, unspecified: Secondary | ICD-10-CM | POA: Diagnosis not present

## 2022-09-15 DIAGNOSIS — E1169 Type 2 diabetes mellitus with other specified complication: Secondary | ICD-10-CM | POA: Diagnosis not present

## 2022-09-15 DIAGNOSIS — G8929 Other chronic pain: Secondary | ICD-10-CM

## 2022-09-15 LAB — POCT GLYCOSYLATED HEMOGLOBIN (HGB A1C): HbA1c, POC (controlled diabetic range): 6.9 % (ref 0.0–7.0)

## 2022-09-15 MED ORDER — ALBUTEROL SULFATE HFA 108 (90 BASE) MCG/ACT IN AERS
2.0000 | INHALATION_SPRAY | Freq: Four times a day (QID) | RESPIRATORY_TRACT | 2 refills | Status: DC | PRN
Start: 2022-09-15 — End: 2023-09-14

## 2022-09-15 NOTE — Assessment & Plan Note (Signed)
HgA1C has greatly improved, it went from 10.0 to 6.9. Continue metformin 500 mg twice daily with meals. Regular exercise as tolerated and healthy diet with avoidance of added sugar food intake is an important part of treatment and recommended. Annual eye exam, periodic dental and foot care to continue. F/U in 5-6 months.

## 2022-09-15 NOTE — Assessment & Plan Note (Signed)
BP is adequately controlled. Continue valsartan-HCTZ 160-25 mg daily and metoprolol succinate 25 mg daily. Monitor BP at home. Continue low-salt/DASH diet. Follow-up in 6 months.

## 2022-09-15 NOTE — Progress Notes (Signed)
HPI: [name] is a 67 y.o. male, who is here today for chronic disease management.  Last seen on 05/12/22 for shortness of breath.  Since his last visit, he followed up with cardiology on 7/2 with Dr. Eldridge Conner.  His Chest CT from 7/11 revealed no adenopathy.   He also followed up with GI and had his colonoscopy 09/03/2022.  Colonoscopy results were normal, repeat in 5 years.   He is not certain of the exact date he quit smoking, but states that 08/2007 sounds about accurate.   COPD: Since his last visit, he continues to experience intermittent shortness of breath with over exertion.  He has also endorsed associated nonproductive cough. He is not using Combivent 20-100 mcg mcg/act, it is very expensive for him since it is not covered by insurance.  Repots that symptoms are stable. . Since his last visit he has been evaluated by cardiologist,Dr Benjamin Conner. He is supposed to follow annually. Coronary CT on 07/16/22:1. Coronary calcium score of 39.6. This was 26 percentile for age and sex matched control. No adenopathy noted.  Bibasilar subpleural atelectasis.  Degenerative changes of the spine.  No acute osseous pathology.  Back pain: Previously followed up with orthopaedics, was seen by Dr. Rudell Conner.  His back pain is not longer radiating to his RLE. Pain exacerbated by prolonged walking and with certain activities. Negative for saddle anesthesia or bowel/bladder function changes.  Diabetes Mellitus II:  - Checking BG at home: Are usually <200s. His last reading was 180s, before eating.  - Medications: Taking Metformin 500 mg, twice daily.  - Compliance: Not taking Jardiance due to pricing at pharmacy.  - Diet: He has not made any major dietary changes. -He is not exercising regularly. - Eye exam: Does routine vision exam annually.   - Negative for symptoms of hypoglycemia, polyuria, polydipsia, numbness extremities, foot ulcers/trauma  Lab Results  Component Value Date    HGBA1C 10.0 (A) 05/12/2022   Lab Results  Component Value Date   MICROALBUR 1.3 06/30/2021   Hypertension:  Medications: valsartan-HCTZ 160-25 mg once daily and metoprolol succinate 25 mg once daily.  BP readings at home: Has a wrist cuff to monitor his BP.  Side effects: tolerating well.   Negative for unusual or severe headache, visual changes, exertional chest pain, dyspnea,  focal weakness.  Lab Results  Component Value Date   CREATININE 0.82 07/07/2022   BUN 17 07/07/2022   NA 139 07/07/2022   K 4.4 07/07/2022   CL 102 07/07/2022   CO2 23 07/07/2022   Hypercalcemia: Problem has been going on for a few years. Negative for MS changes, depressed mood, abdominal pain, or changes in bowel habits.  Lab Results  Component Value Date   PTH 57 10/27/2021   CALCIUM 10.8 (H) 07/07/2022   CAION 5.6 (H) 10/27/2021   Review of Systems  Constitutional:  Negative for activity change, appetite change, chills and fever.  HENT:  Negative for nosebleeds and sore throat.   Gastrointestinal:  Negative for nausea and vomiting.  Endocrine: Negative for cold intolerance and heat intolerance.  Genitourinary:  Negative for decreased urine volume, dysuria and hematuria.  Skin:  Negative for rash.  Neurological:  Negative for syncope and facial asymmetry.  Hematological:  Negative for adenopathy. Does not bruise/bleed easily.  See other pertinent positives and negatives in HPI.  Current Outpatient Medications on File Prior to Visit  Medication Sig Dispense Refill   Accu-Chek Softclix Lancets lancets Use as instructed 100 each 12  aspirin 81 MG tablet Take 81 mg by mouth daily.     atorvastatin (LIPITOR) 80 MG tablet Take 1 tablet by mouth once daily 90 tablet 0   Blood Glucose Monitoring Suppl (ACCU-CHEK GUIDE) w/Device KIT Use to check blood sugars 1-2 times daily; dx:e11.9 1 kit 0   Cholecalciferol (VITAMIN D PO) Take by mouth.     esomeprazole (NEXIUM) 20 MG packet Take 20 mg by mouth  daily as needed.     fluticasone (FLONASE) 50 MCG/ACT nasal spray Use 2 spray(s) in each nostril once daily 48 g 0   glucose blood test strip Use to test blood sugars 1-2 times daily. 100 each 12   metFORMIN (GLUCOPHAGE) 500 MG tablet Take 1 tablet (500 mg total) by mouth 2 (two) times daily with a meal. 180 tablet 1   Misc Natural Products (MENS PROSTATE HEALTH FORMULA PO) Take by mouth.     valsartan-hydrochlorothiazide (DIOVAN-HCT) 160-25 MG tablet Take 1 tablet by mouth once daily 90 tablet 0   No current facility-administered medications on file prior to visit.   Past Medical History:  Diagnosis Date   BACK PAIN    chronic   COPD (chronic obstructive pulmonary disease) (HCC)    Diabetes mellitus without complication (HCC)    GERD    Heart murmur    Hyperlipidemia    HYPERTENSION    HYPERTROPHY PROSTATE W/UR OBST & OTH LUTS    Nocturia    Numbness of hand    since 2005   OSTEOARTHRITIS    PEPTIC ULCER DISEASE    Personal history of colonic adenomas 09/30/2012   Pneumonia    Sleep apnea    Snoring disorder    Allergies  Allergen Reactions   Asa [Aspirin]     Reacts as a sleeping pill   Social History   Socioeconomic History   Marital status: Married    Spouse name: Benjamin Conner   Number of children: 2   Years of education: 12   Highest education level: GED or equivalent  Occupational History   Occupation: Conservation officer, nature    Comment: [employer]  Tobacco Use   Smoking status: Former    Current packs/day: 0.00    Average packs/day: 1 pack/day for 36.0 years (36.0 ttl pk-yrs)    Types: Cigarettes    Start date: 01/06/1971    Quit date: 01/06/2007    Years since quitting: 15.7   Smokeless tobacco: Never   Tobacco comments:    quit 8\2009  Vaping Use   Vaping status: Never Used  Substance and Sexual Activity   Alcohol use: Yes    Alcohol/week: 3.0 standard drinks of alcohol    Types: 3 Shots of liquor per week    Comment: 2-3 daily   Drug use: Never   Sexual activity:  Yes  Other Topics Concern   Not on file  Social History Narrative   Patient is married to [place]. Patient has a high school education. Patient is a Conservation officer, nature for [employer].   Caffeine- four daily.   Left handed.   Social Determinants of Health   Financial Resource Strain: Medium Risk (09/14/2022)   Overall Financial Resource Strain (CARDIA)    Difficulty of Paying Living Expenses: Somewhat hard  Food Insecurity: No Food Insecurity (09/14/2022)   Hunger Vital Sign    Worried About Running Out of Food in the Last Year: Never true    Ran Out of Food in the Last Year: Never true  Transportation Needs: No Transportation Needs (  09/14/2022)   PRAPARE - Administrator, Civil Service (Medical): No    Lack of Transportation (Non-Medical): No  Physical Activity: Sufficiently Active (09/14/2022)   Exercise Vital Sign    Days of Exercise per Week: 3 days    Minutes of Exercise per Session: 150+ min  Stress: Stress Concern Present (09/14/2022)   [employer] of Occupational Health - Occupational Stress Questionnaire    Feeling of Stress : To some extent  Social Connections: Socially Isolated (09/14/2022)   Social Connection and Isolation Panel [NHANES]    Frequency of Communication with Friends and Family: Never    Frequency of Social Gatherings with Friends and Family: Never    Attends Religious Services: Never    Database administrator or Organizations: No    Attends Engineer, structural: Not on file    Marital Status: Married    Vitals:   09/15/22 1353  BP: 122/70  Pulse: 60  Resp: 16  Temp: 98.3 F (36.8 C)  SpO2: 98%   Body mass index is 27.75 kg/m.  Physical Exam Vitals and nursing note reviewed.  Constitutional:      General: He is not in acute distress.    Appearance: He is well-developed.  HENT:     Head: Normocephalic and atraumatic.     Mouth/Throat:     Mouth: Mucous membranes are moist.     Pharynx: Oropharynx is clear.  Eyes:      Conjunctiva/sclera: Conjunctivae normal.  Cardiovascular:     Rate and Rhythm: Normal rate and regular rhythm.     Pulses:          Posterior tibial pulses are 2+ on the right side and 2+ on the left side.     Heart sounds: Murmur (SEM I/VI RUSB) heard.     Comments: Trace edema LE, bilaterally  Pulmonary:     Effort: Pulmonary effort is normal. No respiratory distress.     Breath sounds: Normal breath sounds.  Abdominal:     Palpations: Abdomen is soft. There is no hepatomegaly or mass.     Tenderness: There is no abdominal tenderness.  Lymphadenopathy:     Cervical: No cervical adenopathy.  Skin:    General: Skin is warm.     Findings: No erythema or rash.  Neurological:     General: No focal deficit present.     Mental Status: He is alert and oriented to person, place, and time.     Cranial Nerves: No cranial nerve deficit.     Gait: Gait normal.     Comments: Mildly antalgic gait, not assisted.  Psychiatric:        Mood and Affect: Mood and affect normal.   ASSESSMENT AND PLAN:  Mr. Roosevelt was seen today for follow up.  Orders Placed This Encounter  Procedures   Hepatic function panel   Microalbumin / creatinine urine ratio   PTH, Intact and Calcium   Phosphorus   VITAMIN D 25 Hydroxy (Vit-D Deficiency, Fractures)   Protein Electrophoresis,Random Urn   POC HgB A1c   Chronic bilateral low back pain with right-sided sciatica Assessment & Plan: Radiation to right lower extremity has resolved. Still complaining of bilateral lower back pain. For now continue Tylenol 500 mg 3-4 times per day. We could arrange appointment with orthopedist, he prefers to hold on this for now. Lumbar x-ray done in 03/2022 showed multilevel degenerative changes.   Hypercalcemia Assessment & Plan: Chronic, for at least 9 years. We  discussed possible etiologies. Last PTH was normal at 57. Further recommendation will be given according to lab results.  Vitamin D was mildly low in the  past, continue current dose of vitamin D supplementation.   Orders: -     Hepatic function panel; Future -     PTH, intact and calcium -     Phosphorus -     VITAMIN D 25 Hydroxy (Vit-D Deficiency, Fractures); Future -     Protein Electrophoresis,Random Urn  Type 2 diabetes mellitus with other specified complication, without long-term current use of insulin (HCC) Assessment & Plan: HgA1C has greatly improved, it went from 10.0 to 6.9. Continue metformin 500 mg twice daily with meals. Regular exercise as tolerated and healthy diet with avoidance of added sugar food intake is an important part of treatment and recommended. Annual eye exam, periodic dental and foot care to continue. F/U in 5-6 months.  Orders: -     POCT glycosylated hemoglobin (Hb A1C) -     Microalbumin / creatinine urine ratio; Future  Essential hypertension Assessment & Plan: BP is adequately controlled. Continue valsartan-HCTZ 160-25 mg daily and metoprolol succinate 25 mg daily. Monitor BP at home. Continue low-salt/DASH diet. Follow-up in 6 months.  Orders: -     Hepatic function panel; Future  Chronic obstructive pulmonary disease, unspecified COPD type (HCC) Assessment & Plan: He is not interested in pulmonologist consultation. Combivent has a high co-pay. He has used albuterol in the past, he would like to resume it. Albuterol inhaler 1 to 2 puff every 4-6 hours as needed for dyspnea and coughing/wheezing.  He can use it before engaging in moderate physical activity.  Former smoker. Recent coronary CT calcium score with no adenopathies or lung lesions reported .Bibasilar subpleural atelectasis were seen.  Orders: -     Albuterol Sulfate HFA; Inhale 2 puffs into the lungs every 6 (six) hours as needed for wheezing or shortness of breath.  Dispense: 8 g; Refill: 2   Return in about 6 months (around 03/15/2023) for chronic problems.  I,Rachel Rivera,acting as a scribe for Kaelum Kissick [place], MD.,have  documented all relevant documentation on the behalf of Denise Washburn [place], MD,as directed by  Turon Kilmer [place], MD while in the presence of Galia Rahm [place], MD.  I, Yula Crotwell [place], MD, have reviewed all documentation for this visit. The documentation on 09/15/22 for the exam, diagnosis, procedures, and orders are all accurate and complete.   Bralen Wiltgen G. [place], MD  [facility]. [place] office.

## 2022-09-15 NOTE — Assessment & Plan Note (Signed)
Radiation to right lower extremity has resolved. Still complaining of bilateral lower back pain. For now continue Tylenol 500 mg 3-4 times per day. We could arrange appointment with orthopedist, he prefers to hold on this for now. Lumbar x-ray done in 03/2022 showed multilevel degenerative changes.

## 2022-09-15 NOTE — Assessment & Plan Note (Addendum)
Chronic, for at least 9 years. We discussed possible etiologies. Last PTH was normal at 57. Further recommendation will be given according to lab results.  Vitamin D was mildly low in the past, continue current dose of vitamin D supplementation.

## 2022-09-15 NOTE — Patient Instructions (Addendum)
A few things to remember from today's visit:  Type 2 diabetes mellitus with other specified complication, without long-term current use of insulin (HCC) - Plan: POC HgB A1c, Microalbumin / creatinine urine ratio  Essential hypertension - Plan: Hepatic function panel  Hypercalcemia - Plan: Hepatic function panel, PTH, Intact and Calcium, Phosphorus, VITAMIN D 25 Hydroxy (Vit-D Deficiency, Fractures), Protein Electrophoresis,Random Urn  Chronic obstructive pulmonary disease, unspecified COPD type (HCC)  No changes today.  If you need refills for medications you take chronically, please call your pharmacy. Do not use My Chart to request refills or for acute issues that need immediate attention. If you send a my chart message, it may take a few days to be addressed, specially if I am not in the office.  Please be sure medication list is accurate. If a new problem present, please set up appointment sooner than planned today.

## 2022-09-15 NOTE — Assessment & Plan Note (Addendum)
He is not interested in pulmonologist consultation. Combivent has a high co-pay. He has used albuterol in the past, he would like to resume it. Albuterol inhaler 1 to 2 puff every 4-6 hours as needed for dyspnea and coughing/wheezing.  He can use it before engaging in moderate physical activity.  Former smoker. Recent coronary CT calcium score with no adenopathies or lung lesions reported .Bibasilar subpleural atelectasis were seen.

## 2022-09-16 LAB — VITAMIN D 25 HYDROXY (VIT D DEFICIENCY, FRACTURES): VITD: 44.52 ng/mL (ref 30.00–100.00)

## 2022-09-16 LAB — HEPATIC FUNCTION PANEL
ALT: 29 U/L (ref 0–53)
AST: 21 U/L (ref 0–37)
Albumin: 4.1 g/dL (ref 3.5–5.2)
Alkaline Phosphatase: 87 U/L (ref 39–117)
Bilirubin, Direct: 0.1 mg/dL (ref 0.0–0.3)
Total Bilirubin: 0.6 mg/dL (ref 0.2–1.2)
Total Protein: 6.8 g/dL (ref 6.0–8.3)

## 2022-09-16 LAB — PHOSPHORUS: Phosphorus: 2.9 mg/dL (ref 2.3–4.6)

## 2022-09-16 LAB — MICROALBUMIN / CREATININE URINE RATIO
Creatinine,U: 67.7 mg/dL
Microalb Creat Ratio: 1.1 mg/g (ref 0.0–30.0)
Microalb, Ur: 0.7 mg/dL (ref 0.0–1.9)

## 2022-09-18 LAB — PTH, INTACT AND CALCIUM
Calcium: 10.6 mg/dL — ABNORMAL HIGH (ref 8.6–10.3)
PTH: 59 pg/mL (ref 16–77)

## 2022-09-18 LAB — PROTEIN ELECTROPHORESIS,RANDOM URN

## 2022-10-19 ENCOUNTER — Other Ambulatory Visit: Payer: Self-pay | Admitting: Family Medicine

## 2022-10-19 DIAGNOSIS — J302 Other seasonal allergic rhinitis: Secondary | ICD-10-CM

## 2022-10-28 DIAGNOSIS — R059 Cough, unspecified: Secondary | ICD-10-CM | POA: Diagnosis not present

## 2022-12-10 ENCOUNTER — Other Ambulatory Visit: Payer: Self-pay | Admitting: Family Medicine

## 2022-12-10 DIAGNOSIS — E1169 Type 2 diabetes mellitus with other specified complication: Secondary | ICD-10-CM

## 2022-12-10 DIAGNOSIS — I10 Essential (primary) hypertension: Secondary | ICD-10-CM

## 2023-03-16 ENCOUNTER — Encounter: Payer: Self-pay | Admitting: Family Medicine

## 2023-03-16 ENCOUNTER — Ambulatory Visit (INDEPENDENT_AMBULATORY_CARE_PROVIDER_SITE_OTHER): Payer: Medicare Other | Admitting: Family Medicine

## 2023-03-16 VITALS — BP 132/80 | HR 73 | Resp 16 | Ht 75.0 in | Wt 217.5 lb

## 2023-03-16 DIAGNOSIS — I10 Essential (primary) hypertension: Secondary | ICD-10-CM | POA: Diagnosis not present

## 2023-03-16 DIAGNOSIS — Z23 Encounter for immunization: Secondary | ICD-10-CM

## 2023-03-16 DIAGNOSIS — Z5320 Procedure and treatment not carried out because of patient's decision for unspecified reasons: Secondary | ICD-10-CM | POA: Insufficient documentation

## 2023-03-16 DIAGNOSIS — J449 Chronic obstructive pulmonary disease, unspecified: Secondary | ICD-10-CM | POA: Diagnosis not present

## 2023-03-16 DIAGNOSIS — E785 Hyperlipidemia, unspecified: Secondary | ICD-10-CM | POA: Diagnosis not present

## 2023-03-16 DIAGNOSIS — E1169 Type 2 diabetes mellitus with other specified complication: Secondary | ICD-10-CM | POA: Diagnosis not present

## 2023-03-16 DIAGNOSIS — Z7984 Long term (current) use of oral hypoglycemic drugs: Secondary | ICD-10-CM

## 2023-03-16 LAB — COMPREHENSIVE METABOLIC PANEL
ALT: 39 U/L (ref 0–53)
AST: 27 U/L (ref 0–37)
Albumin: 4.6 g/dL (ref 3.5–5.2)
Alkaline Phosphatase: 85 U/L (ref 39–117)
BUN: 14 mg/dL (ref 6–23)
CO2: 29 meq/L (ref 19–32)
Calcium: 10.6 mg/dL — ABNORMAL HIGH (ref 8.4–10.5)
Chloride: 101 meq/L (ref 96–112)
Creatinine, Ser: 0.81 mg/dL (ref 0.40–1.50)
GFR: 91.49 mL/min (ref >60.00)
Glucose, Bld: 113 mg/dL — ABNORMAL HIGH (ref 70–99)
Potassium: 4 meq/L (ref 3.5–5.1)
Sodium: 138 meq/L (ref 135–145)
Total Bilirubin: 0.7 mg/dL (ref 0.2–1.2)
Total Protein: 6.9 g/dL (ref 6.0–8.3)

## 2023-03-16 LAB — LIPID PANEL
Cholesterol: 160 mg/dL (ref 0–200)
HDL: 51.8 mg/dL (ref >39.00)
LDL Cholesterol: 66 mg/dL (ref 0–99)
NonHDL: 108.08
Total CHOL/HDL Ratio: 3
Triglycerides: 209 mg/dL — ABNORMAL HIGH (ref 0.0–149.0)
VLDL: 41.8 mg/dL — ABNORMAL HIGH (ref 0.0–40.0)

## 2023-03-16 LAB — HEMOGLOBIN A1C: Hgb A1c MFr Bld: 6.2 % (ref 4.6–6.5)

## 2023-03-16 MED ORDER — ATORVASTATIN CALCIUM 80 MG PO TABS
80.0000 mg | ORAL_TABLET | Freq: Every day | ORAL | 3 refills | Status: AC
Start: 2023-03-16 — End: ?

## 2023-03-16 MED ORDER — VALSARTAN-HYDROCHLOROTHIAZIDE 160-25 MG PO TABS
1.0000 | ORAL_TABLET | Freq: Every day | ORAL | 2 refills | Status: DC
Start: 2023-03-16 — End: 2023-09-14

## 2023-03-16 MED ORDER — METOPROLOL SUCCINATE ER 25 MG PO TB24
25.0000 mg | ORAL_TABLET | Freq: Every day | ORAL | 2 refills | Status: AC
Start: 2023-03-16 — End: ?

## 2023-03-16 NOTE — Patient Instructions (Signed)
 A few things to remember from today's visit:  Type 2 diabetes mellitus with other specified complication, without long-term current use of insulin (HCC) - Plan: Comprehensive metabolic panel, Hemoglobin A1c  Essential hypertension - Plan: metoprolol succinate (TOPROL-XL) 25 MG 24 hr tablet, valsartan-hydrochlorothiazide (DIOVAN-HCT) 160-25 MG tablet, Comprehensive metabolic panel  Hyperlipidemia associated with type 2 diabetes mellitus (HCC) - Plan: atorvastatin (LIPITOR) 80 MG tablet, Comprehensive metabolic panel, Lipid panel  No changes today.  If you need refills for medications you take chronically, please call your pharmacy. Do not use My Chart to request refills or for acute issues that need immediate attention. If you send a my chart message, it may take a few days to be addressed, specially if I am not in the office.  Please be sure medication list is accurate. If a new problem present, please set up appointment sooner than planned today.

## 2023-03-16 NOTE — Assessment & Plan Note (Signed)
 Exacerbated by recent influenza infection. Symptoms has improved. He would like to continue Albuterol inh, other inhalers are more expensive.

## 2023-03-16 NOTE — Assessment & Plan Note (Signed)
 BP mildly elevated today, has been in 120's/70's. No changes in Valsartan-hydrochlorothiazide or metoprolol dose for now. Recommend monitoring BP at home. Continue low salt diet. Has an eye exam scheduled.

## 2023-03-16 NOTE — Progress Notes (Unsigned)
 HPI: [name] is a 68 y.o. male with a PMHx significant for HTN, OSA, COPD, GERD, DM II, HLD, and OA, who is here today for chronic disease management.  Last seen on 09/15/2022. No new  Problems since his last visit.  Hypertension: Since 2010. Currently on metoprolol succinate 25 mg daily and valsartan-hydrochlorothiazide 160-25 mg daily.  He has a digital BP cuff but does not remember any of his readings.  Negative for unusual or severe headache, visual changes, exertional chest pain, dyspnea, palpitations, focal weakness, or edema.  Lab Results  Component Value Date   CREATININE 0.82 07/07/2022   BUN 17 07/07/2022   NA 139 07/07/2022   K 4.4 07/07/2022   CL 102 07/07/2022   CO2 23 07/07/2022   Diabetes Mellitus II: Dx'ed in 2017. - Checking BG at home: He is checking his BG regularly at home and says his readings have been 120s-130s.  - Medications: Currently on Metformin 500 mg twice daily.  - eye exam: He has an appointment for 3/13.  - foot exam: Performed in 05/2022 - He endorses occasional numbness, tingling, and burning in his feet.  - Negative for symptoms of hypoglycemia, polyuria, polydipsia, foot ulcers/trauma  Lab Results  Component Value Date   HGBA1C 6.9 09/15/2022   Lab Results  Component Value Date   MICROALBUR 0.7 09/15/2022   Hyperlipidemia: Currently on atorvastatin 80 mg daily.  Side effects from medication: none Lab Results  Component Value Date   CHOL 170 10/27/2021   HDL 48.00 10/27/2021   LDLCALC 101 (H) 03/08/2020   LDLDIRECT 82.0 10/27/2021   TRIG 375.0 (H) 10/27/2021   CHOLHDL 4 10/27/2021   He mentions he had Flu in February and was seen at [facility]. He was given Tamiflu, benzonatate, and albuterol which helped significantly.  He still has some residual cough.  COPD on Albuterol inh as needed. His health insurance did not cover other inhalers.  Review of Systems  Constitutional:  Negative for activity change, appetite  change, chills and fever.  HENT:  Negative for sore throat and trouble swallowing.   Respiratory:  Negative for wheezing.   Gastrointestinal:  Negative for abdominal pain, nausea and vomiting.  Genitourinary:  Negative for decreased urine volume, dysuria and hematuria.  Skin:  Negative for rash.  Neurological:  Negative for syncope and facial asymmetry.  Psychiatric/Behavioral:  Negative for confusion and hallucinations.   See other pertinent positives and negatives in HPI.  Current Outpatient Medications on File Prior to Visit  Medication Sig Dispense Refill   Accu-Chek Softclix Lancets lancets Use as instructed 100 each 12   albuterol (VENTOLIN HFA) 108 (90 Base) MCG/ACT inhaler Inhale 2 puffs into the lungs every 6 (six) hours as needed for wheezing or shortness of breath. 8 g 2   aspirin 81 MG tablet Take 81 mg by mouth daily.     Blood Glucose Monitoring Suppl (ACCU-CHEK GUIDE) w/Device KIT Use to check blood sugars 1-2 times daily; dx:e11.9 1 kit 0   Cholecalciferol (VITAMIN D PO) Take by mouth.     esomeprazole (NEXIUM) 20 MG packet Take 20 mg by mouth daily as needed.     fluticasone (FLONASE) 50 MCG/ACT nasal spray Use 2 spray(s) in each nostril once daily 48 g 1   glucose blood test strip Use to test blood sugars 1-2 times daily. 100 each 12   metFORMIN (GLUCOPHAGE) 500 MG tablet Take 1 tablet (500 mg total) by mouth 2 (two) times daily with a meal.  180 tablet 1   Misc Natural Products (MENS PROSTATE HEALTH FORMULA PO) Take by mouth.     No current facility-administered medications on file prior to visit.   Past Medical History:  Diagnosis Date   BACK PAIN    chronic   COPD (chronic obstructive pulmonary disease) (HCC)    Diabetes mellitus without complication (HCC)    GERD    Heart murmur    Hyperlipidemia    HYPERTENSION    HYPERTROPHY PROSTATE W/UR OBST & OTH LUTS    Nocturia    Numbness of hand    since 2005   OSTEOARTHRITIS    PEPTIC ULCER DISEASE    Personal  history of colonic adenomas 09/30/2012   Pneumonia    Sleep apnea    Snoring disorder    Allergies  Allergen Reactions   Asa [Aspirin]     Reacts as a sleeping pill   Social History   Socioeconomic History   Marital status: Married    Spouse name: Benjamin Conner   Number of children: 2   Years of education: 12   Highest education level: 12th grade  Occupational History   Occupation: Conservation officer, nature    Comment: [employer]  Tobacco Use   Smoking status: Former    Current packs/day: 0.00    Average packs/day: 1 pack/day for 36.0 years (36.0 ttl pk-yrs)    Types: Cigarettes    Start date: 01/06/1971    Quit date: 01/06/2007    Years since quitting: 16.2   Smokeless tobacco: Never   Tobacco comments:    quit 8\2009  Vaping Use   Vaping status: Never Used  Substance and Sexual Activity   Alcohol use: Yes    Alcohol/week: 3.0 standard drinks of alcohol    Types: 3 Shots of liquor per week    Comment: 2-3 daily   Drug use: Never   Sexual activity: Yes  Other Topics Concern   Not on file  Social History Narrative   Patient is married to Benjamin Conner. Patient has a high school education. Patient is a Conservation officer, nature for [employer].   Caffeine- four daily.   Left handed.   Social Drivers of Health   Financial Resource Strain: Medium Risk (03/15/2023)   Overall Financial Resource Strain (CARDIA)    Difficulty of Paying Living Expenses: Somewhat hard  Food Insecurity: No Food Insecurity (03/15/2023)   Hunger Vital Sign    Worried About Running Out of Food in the Last Year: Never true    Ran Out of Food in the Last Year: Never true  Transportation Needs: No Transportation Needs (03/15/2023)   PRAPARE - Administrator, Civil Service (Medical): No    Lack of Transportation (Non-Medical): No  Physical Activity: Sufficiently Active (03/15/2023)   Exercise Vital Sign    Days of Exercise per Week: 5 days    Minutes of Exercise per Session: 150+ min  Stress: No Stress Concern Present  (03/15/2023)   [employer] of Occupational Health - Occupational Stress Questionnaire    Feeling of Stress : Not at all  Social Connections: Moderately Isolated (03/15/2023)   Social Connection and Isolation Panel [NHANES]    Frequency of Communication with Friends and Family: Never    Frequency of Social Gatherings with Friends and Family: Once a week    Attends Religious Services: 1 to 4 times per year    Active Member of [employer] or Organizations: No    Attends Banker Meetings: Not on file  Marital Status: Married   Vitals:   03/16/23 1400  BP: 132/80  Pulse: 73  Resp: 16  SpO2: 97%   Body mass index is 27.19 kg/m.  Physical Exam Vitals and nursing note reviewed.  Constitutional:      General: He is not in acute distress.    Appearance: He is well-developed.  HENT:     Head: Normocephalic and atraumatic.     Mouth/Throat:     Mouth: Mucous membranes are moist.     Pharynx: Oropharynx is clear. Uvula midline.  Eyes:     Conjunctiva/sclera: Conjunctivae normal.  Cardiovascular:     Rate and Rhythm: Normal rate and regular rhythm.     Pulses:          Dorsalis pedis pulses are 2+ on the right side and 2+ on the left side.     Heart sounds: Murmur (SEM I/VI RUSB) heard.  Pulmonary:     Effort: Pulmonary effort is normal. No respiratory distress.     Breath sounds: Normal breath sounds.  Abdominal:     Palpations: Abdomen is soft. There is no hepatomegaly or mass.     Tenderness: There is no abdominal tenderness.  Musculoskeletal:     Right lower leg: No edema.     Left lower leg: No edema.  Skin:    General: Skin is warm.     Findings: No erythema or rash.  Neurological:     Mental Status: He is alert and oriented to person, place, and time.     Cranial Nerves: No cranial nerve deficit.     Gait: Gait normal.  Psychiatric:        Mood and Affect: Mood and affect normal.   ASSESSMENT AND PLAN:  Mr. Prusinski was seen today for chronic disease  management.   Orders Placed This Encounter  Procedures   Pneumococcal conjugate vaccine 20-valent (Prevnar 20)   Comprehensive metabolic panel   Hemoglobin A1c   Lipid panel   Lab Results  Component Value Date   CHOL 160 03/16/2023   HDL 51.80 03/16/2023   LDLCALC 66 03/16/2023   LDLDIRECT 82.0 10/27/2021   TRIG 209.0 (H) 03/16/2023   CHOLHDL 3 03/16/2023   Lab Results  Component Value Date   ALT 39 03/16/2023   AST 27 03/16/2023   ALKPHOS 85 03/16/2023   BILITOT 0.7 03/16/2023   Lab Results  Component Value Date   HGBA1C 6.2 03/16/2023   Lab Results  Component Value Date   NA 138 03/16/2023   CL 101 03/16/2023   K 4.0 03/16/2023   CO2 29 03/16/2023   BUN 14 03/16/2023   CREATININE 0.81 03/16/2023   GFR 91.49 03/16/2023   CALCIUM 10.6 (H) 03/16/2023   PHOS 2.9 09/15/2022   ALBUMIN 4.6 03/16/2023   GLUCOSE 113 (H) 03/16/2023   Type 2 diabetes mellitus with other specified complication, without long-term current use of insulin (HCC) Assessment & Plan: HgA1C was 6.9 in 09/2022. Continue metformin 500 mg twice daily with meals. Further recommendations according to HgA1C result. Annual eye exam, periodic dental and foot care to continue. F/U in 5-6 months.  Orders: -     Comprehensive metabolic panel; Future -     Hemoglobin A1c; Future  Essential hypertension Assessment & Plan: BP mildly elevated today, has been in 120's/70's. No changes in Valsartan-hydrochlorothiazide or metoprolol dose for now. Recommend monitoring BP at home. Continue low salt diet. Has an eye exam scheduled.  Orders: -  Metoprolol Succinate ER; Take 1 tablet (25 mg total) by mouth daily.  Dispense: 90 tablet; Refill: 2 -     Valsartan-hydroCHLOROthiazide; Take 1 tablet by mouth daily.  Dispense: 90 tablet; Refill: 2 -     Comprehensive metabolic panel; Future  Hyperlipidemia associated with type 2 diabetes mellitus (HCC) Assessment & Plan: Last LDL 82 in 10/2021. Continue  Atorvastatin 80 mg daily and low fat diet. Further recommendations according to lipid panel result.  Orders: -     Atorvastatin Calcium; Take 1 tablet (80 mg total) by mouth daily.  Dispense: 90 tablet; Refill: 3 -     Comprehensive metabolic panel; Future -     Lipid panel; Future  Lung cancer screening declined by patient Assessment & Plan: We discussed recommendations in regard to lung cancer screening. Declined.   Need for pneumococcal vaccination -     Pneumococcal conjugate vaccine 20-valent  Chronic obstructive pulmonary disease, unspecified COPD type (HCC) Assessment & Plan: Exacerbated by recent influenza infection. Symptoms has improved. He would like to continue Albuterol inh, other inhalers are more expensive.   Return in about 6 months (around 09/16/2023) for chronic problems.  I, Rolla Etienne Wierda, acting as a scribe for Sai Moura [place], MD., have documented all relevant documentation on the behalf of Levonia Wolfley [place], MD, as directed by  Corvette Orser [place], MD while in the presence of Burnett Lieber [place], MD.   I, Ilean Spradlin [place], MD, have reviewed all documentation for this visit. The documentation on 03/16/23 for the exam, diagnosis, procedures, and orders are all accurate and complete.  Tamyrah Burbage G. [place], MD  [facility]. [place] office.

## 2023-03-16 NOTE — Assessment & Plan Note (Signed)
 HgA1C was 6.9 in 09/2022. Continue metformin 500 mg twice daily with meals. Further recommendations according to HgA1C result. Annual eye exam, periodic dental and foot care to continue. F/U in 5-6 months.

## 2023-03-18 ENCOUNTER — Encounter: Payer: Self-pay | Admitting: Family Medicine

## 2023-03-18 DIAGNOSIS — E119 Type 2 diabetes mellitus without complications: Secondary | ICD-10-CM | POA: Diagnosis not present

## 2023-03-18 NOTE — Assessment & Plan Note (Signed)
 We discussed recommendations in regard to lung cancer screening. Declined.

## 2023-03-18 NOTE — Assessment & Plan Note (Signed)
 Last LDL 82 in 10/2021. Continue Atorvastatin 80 mg daily and low fat diet. Further recommendations according to lipid panel result.

## 2023-05-03 ENCOUNTER — Other Ambulatory Visit: Payer: Self-pay | Admitting: Family Medicine

## 2023-05-03 DIAGNOSIS — E1169 Type 2 diabetes mellitus with other specified complication: Secondary | ICD-10-CM

## 2023-05-03 MED ORDER — METFORMIN HCL 500 MG PO TABS: 500.0000 mg | ORAL_TABLET | Freq: Two times a day (BID) | ORAL | 1 refills | Status: DC

## 2023-05-03 NOTE — Addendum Note (Signed)
 Addended by: Doll French on: 05/03/2023 04:14 PM   Modules accepted: Orders

## 2023-05-16 DIAGNOSIS — R030 Elevated blood-pressure reading, without diagnosis of hypertension: Secondary | ICD-10-CM | POA: Diagnosis not present

## 2023-05-16 DIAGNOSIS — S7001XA Contusion of right hip, initial encounter: Secondary | ICD-10-CM | POA: Diagnosis not present

## 2023-09-14 ENCOUNTER — Ambulatory Visit (INDEPENDENT_AMBULATORY_CARE_PROVIDER_SITE_OTHER): Admitting: Family Medicine

## 2023-09-14 ENCOUNTER — Encounter: Payer: Self-pay | Admitting: Family Medicine

## 2023-09-14 VITALS — BP 160/80 | HR 57 | Temp 97.9°F | Resp 16 | Ht 75.0 in | Wt 231.4 lb

## 2023-09-14 DIAGNOSIS — E559 Vitamin D deficiency, unspecified: Secondary | ICD-10-CM

## 2023-09-14 DIAGNOSIS — E1169 Type 2 diabetes mellitus with other specified complication: Secondary | ICD-10-CM | POA: Diagnosis not present

## 2023-09-14 DIAGNOSIS — R0789 Other chest pain: Secondary | ICD-10-CM | POA: Diagnosis not present

## 2023-09-14 DIAGNOSIS — I10 Essential (primary) hypertension: Secondary | ICD-10-CM

## 2023-09-14 DIAGNOSIS — Z7984 Long term (current) use of oral hypoglycemic drugs: Secondary | ICD-10-CM | POA: Diagnosis not present

## 2023-09-14 DIAGNOSIS — J449 Chronic obstructive pulmonary disease, unspecified: Secondary | ICD-10-CM | POA: Diagnosis not present

## 2023-09-14 DIAGNOSIS — Z23 Encounter for immunization: Secondary | ICD-10-CM | POA: Diagnosis not present

## 2023-09-14 LAB — POCT GLYCOSYLATED HEMOGLOBIN (HGB A1C): Hemoglobin A1C: 7.1 % — AB (ref 4.0–5.6)

## 2023-09-14 MED ORDER — ALBUTEROL SULFATE HFA 108 (90 BASE) MCG/ACT IN AERS: 2.0000 | INHALATION_SPRAY | Freq: Four times a day (QID) | RESPIRATORY_TRACT | 3 refills | Status: AC | PRN

## 2023-09-14 MED ORDER — VALSARTAN-HYDROCHLOROTHIAZIDE 320-25 MG PO TABS: 1.0000 | ORAL_TABLET | Freq: Every day | ORAL | 1 refills | Status: AC

## 2023-09-14 MED ORDER — EMPAGLIFLOZIN 10 MG PO TABS: 10.0000 mg | ORAL_TABLET | Freq: Every day | ORAL | 1 refills | Status: DC

## 2023-09-14 NOTE — Patient Instructions (Addendum)
 A few things to remember from today's visit:  Type 2 diabetes mellitus with other specified complication, without long-term current use of insulin (HCC) - Plan: POC HgB A1c, empagliflozin  (JARDIANCE ) 10 MG TABS tablet, Microalbumin / creatinine urine ratio  Chest discomfort - Plan: EKG 12-Lead  Chronic obstructive pulmonary disease, unspecified COPD type (HCC) - Plan: albuterol  (VENTOLIN  HFA) 108 (90 Base) MCG/ACT inhaler  Hypercalcemia - Plan: Basic metabolic panel with GFR, Protein Electrophoresis, Urine Rflx.  Essential hypertension - Plan: Basic metabolic panel with GFR, valsartan -hydrochlorothiazide  (DIOVAN -HCT) 320-25 MG tablet  Vitamin D  deficiency, unspecified - Plan: VITAMIN D  25 Hydroxy (Vit-D Deficiency, Fractures)  Please arrange appt with your cardiologist. Jardiance  10 mg daily before breakfast added today for diabetes. Valsartan  dose increased, hydrochlorothiazide  unchanged, so pick up new med at your pharmacy.  If you need refills for medications you take chronically, please call your pharmacy. Do not use My Chart to request refills or for acute issues that need immediate attention. If you send a my chart message, it may take a few days to be addressed, specially if I am not in the office.  Please be sure medication list is accurate. If a new problem present, please set up appointment sooner than planned today.

## 2023-09-14 NOTE — Progress Notes (Signed)
 "   Chief Complaint  Patient presents with   Medical Management of Chronic Issues    Six month follow-up    Discussed the use of AI scribe software for clinical note transcription with the patient, who gave verbal consent to proceed.  History of Present Illness Benjamin Conner is a 68 year old male with hypertension, DM II, and coronary artery disease,HLD,and chronic back pain who presents for follow-up. He was last seen on 03/16/23.  HTN: His blood pressure today is 180/80, checked twice, and he has not been monitoring it at home due to not having the appropriate cuff. The last recorded blood pressure in March was 132/80. He is currently taking metoprolol  succinate 25 mg daily and valsartan  hydrochlorothiazide  160/25 mg daily for hypertension.  Negative for severe/frequent headache, visual changes,focal weakness, or edema.  Today he is c/o chest discomfort. He describes a 'stinging sensation' in the middle of his chest, occurring both at rest and during activity, lasting about five minutes or less. This sensation has been present on and off for the past one to two months, with associated shortness of breath and clamminess.  He has not noted heartburn. GERD: Not longer on Nexium .  He has not contacted his cardiologist or visited the ER for these symptoms.  He last saw his cardiologist in July/2024. He had a coronary CT scan last year that showed mild, non-obstructive coronary disease and a coronary calcium  score of 39.6.  An echocardiogram in May 2013 for a heart murmur showed a ventricular ejection fraction of 55-60%.  Lab Results  Component Value Date   CREATININE 0.81 03/16/2023   BUN 14 03/16/2023   NA 138 03/16/2023   K 4.0 03/16/2023   CL 101 03/16/2023   CO2 29 03/16/2023   DM II:Dx'ed 2017  His blood sugar levels are typically between 150-175 mg/dL before breakfast, with no recent readings in the 200s.  He is taking metformin  500 mg twice daily.  An eye exam a  couple of months ago showed no retinopathy.  HgA1C 6.2 on 03/16/23. Negative for symptoms of hypoglycemia, polyuria, polydipsia, foot ulcers/trauma Occasional feet tingling/burning sensation, stable for some time.  COPD: He uses albuterol  inh daily, particularly in the morning when his breathing feels labored. Albuterol  inh 1-2 times per day seems to help with symptoms. He experiences occasional wheezing and productive cough, which he attributes to COPD. He has tried other inhalers in the past but found them too expensive.   Hypercalcemia for a few years, is has been stable, 10.6-11.0. PTH, intact normal at 57 in 09/2022. Vit D deficiency, he has been on vit D supplementation.  Review of Systems  Constitutional:  Negative for activity change, appetite change, chills and fever.  HENT:  Negative for sore throat and trouble swallowing.   Cardiovascular:  Negative for palpitations and leg swelling.  Gastrointestinal:  Negative for abdominal pain, nausea and vomiting.  Genitourinary:  Negative for decreased urine volume, dysuria and hematuria.  Musculoskeletal:  Positive for back pain.  Neurological:  Negative for syncope, facial asymmetry, weakness and headaches.  Psychiatric/Behavioral:  Negative for confusion. The patient is not nervous/anxious.   See other pertinent positives and negatives in HPI.  Current Outpatient Medications on File Prior to Visit  Medication Sig Dispense Refill   Accu-Chek Softclix Lancets lancets Use as instructed 100 each 12   aspirin 81 MG tablet Take 81 mg by mouth daily.     atorvastatin  (LIPITOR) 80 MG tablet Take 1 tablet (80 mg  total) by mouth daily. 90 tablet 3   Blood Glucose Monitoring Suppl (ACCU-CHEK GUIDE) w/Device KIT Use to check blood sugars 1-2 times daily; dx:e11.9 1 kit 0   Cholecalciferol (VITAMIN D  PO) Take by mouth.     esomeprazole  (NEXIUM ) 20 MG packet Take 20 mg by mouth daily as needed.     fluticasone  (FLONASE ) 50 MCG/ACT nasal spray Use  2 spray(s) in each nostril once daily 48 g 1   glucose blood test strip Use to test blood sugars 1-2 times daily. 100 each 12   metFORMIN  (GLUCOPHAGE ) 500 MG tablet Take 1 tablet (500 mg total) by mouth 2 (two) times daily with a meal. 180 tablet 1   metoprolol  succinate (TOPROL -XL) 25 MG 24 hr tablet Take 1 tablet (25 mg total) by mouth daily. 90 tablet 2   Misc Natural Products (MENS PROSTATE HEALTH FORMULA PO) Take by mouth.     No current facility-administered medications on file prior to visit.   Past Medical History:  Diagnosis Date   BACK PAIN    chronic   COPD (chronic obstructive pulmonary disease) (HCC)    Diabetes mellitus without complication (HCC)    GERD    Heart murmur    Hyperlipidemia    HYPERTENSION    HYPERTROPHY PROSTATE W/UR OBST & OTH LUTS    Nocturia    Numbness of hand    since 2005   OSTEOARTHRITIS    PEPTIC ULCER DISEASE    Personal history of colonic adenomas 09/30/2012   Pneumonia    Sleep apnea    Snoring disorder    Allergies  Allergen Reactions   Asa [Aspirin]     Reacts as a sleeping pill    Social History   Socioeconomic History   Marital status: Married    Spouse name: Almarie   Number of children: 2   Years of education: 12   Highest education level: 12th grade  Occupational History   Occupation: Conservation Officer, Nature    Comment: [employer]  Tobacco Use   Smoking status: Former    Current packs/day: 0.00    Average packs/day: 1 pack/day for 36.0 years (36.0 ttl pk-yrs)    Types: Cigarettes    Start date: 01/06/1971    Quit date: 01/06/2007    Years since quitting: 16.6   Smokeless tobacco: Never   Tobacco comments:    quit 8\2009  Vaping Use   Vaping status: Never Used  Substance and Sexual Activity   Alcohol use: Yes    Alcohol/week: 3.0 standard drinks of alcohol    Types: 3 Shots of liquor per week    Comment: 2-3 daily   Drug use: Never   Sexual activity: Yes  Other Topics Concern   Not on file  Social History Narrative    Patient is married to [place]. Patient has a high school education. Patient is a conservation officer, nature for [employer].   Caffeine- four daily.   Left handed.   Social Drivers of Health   Financial Resource Strain: Medium Risk (03/15/2023)   Overall Financial Resource Strain (CARDIA)    Difficulty of Paying Living Expenses: Somewhat hard  Food Insecurity: No Food Insecurity (03/15/2023)   Hunger Vital Sign    Worried About Running Out of Food in the Last Year: Never true    Ran Out of Food in the Last Year: Never true  Transportation Needs: No Transportation Needs (03/15/2023)   PRAPARE - Administrator, Civil Service (Medical): No  Lack of Transportation (Non-Medical): No  Physical Activity: Sufficiently Active (03/15/2023)   Exercise Vital Sign    Days of Exercise per Week: 5 days    Minutes of Exercise per Session: 150+ min  Stress: No Stress Concern Present (03/15/2023)   [employer] of Occupational Health - Occupational Stress Questionnaire    Feeling of Stress : Not at all  Social Connections: Moderately Isolated (03/15/2023)   Social Connection and Isolation Panel    Frequency of Communication with Friends and Family: Never    Frequency of Social Gatherings with Friends and Family: Once a week    Attends Religious Services: 1 to 4 times per year    Active Member of Clubs or Organizations: No    Attends Banker Meetings: Not on file    Marital Status: Married    Today's Vitals   09/14/23 1406 09/14/23 1502  BP: (!) 180/80 (!) 160/80  Pulse: (!) 57   Resp: 16   Temp: 97.9 F (36.6 C)   SpO2: 96%   Weight: 231 lb 6.4 oz (105 kg)   Height: 6' 3 (1.905 m)     Body mass index is 28.92 kg/m.  Physical Exam Vitals and nursing note reviewed.  Constitutional:      General: He is not in acute distress.    Appearance: He is well-developed.  HENT:     Head: Normocephalic and atraumatic.     Mouth/Throat:     Mouth: Mucous membranes are moist.      Pharynx: Oropharynx is clear.  Eyes:     Conjunctiva/sclera: Conjunctivae normal.  Neck:     Vascular: No JVD.  Cardiovascular:     Rate and Rhythm: Normal rate and regular rhythm.     Pulses:          Dorsalis pedis pulses are 2+ on the right side and 2+ on the left side.     Heart sounds: No murmur (none appreciated today) heard. Pulmonary:     Effort: Pulmonary effort is normal. No respiratory distress.     Breath sounds: Normal breath sounds.  Abdominal:     Palpations: Abdomen is soft. There is no hepatomegaly or mass.     Tenderness: There is no abdominal tenderness.  Lymphadenopathy:     Cervical: No cervical adenopathy.  Skin:    General: Skin is warm.     Findings: No erythema or rash.  Neurological:     Mental Status: He is alert and oriented to person, place, and time.     Cranial Nerves: No cranial nerve deficit.     Gait: Gait normal.  Psychiatric:        Mood and Affect: Mood and affect normal.    ASSESSMENT AND PLAN:  Tijuan was seen today for medical management of chronic issues.  Diagnoses and all orders for this visit:  Orders Placed This Encounter  Procedures   Basic metabolic panel with GFR   Microalbumin / creatinine urine ratio   VITAMIN D  25 Hydroxy (Vit-D Deficiency, Fractures)   Protein Electrophoresis, Urine Rflx.   POC HgB A1c   EKG 12-Lead   Type 2 diabetes mellitus with other specified complication, without long-term current use of insulin (HCC) Assessment & Plan: Hemoglobin A1c went up from 6.2 to 7.1. Continue metformin  500 mg twice daily with meals. Jardiance  10 mg added today. Continue periodic eye exam, dental care, and appropriate footcare. Follow-up in 3 to 4 months.  Orders: -     POCT glycosylated  hemoglobin (Hb A1C) -     Empagliflozin ; Take 1 tablet (10 mg total) by mouth daily.  Dispense: 90 tablet; Refill: 1 -     Microalbumin / creatinine urine ratio; Future  Chest discomfort Had an episode a few minutes ago,  resolved. We discussed possible causes including cardiac, GERD, and musculoskeletal. EKG today: Mild sinus bradycardia, LAD, normal intervals, no signs of acute ischemia. When compared with EKG done 07/2022 septal QS not longer present. Compared with EKG 05/2022 no significant changes except for mild bradycardia. He is overdue for follow up with cardiologist, he is going to call to arrange follow up appt. Clearly instructed about warning signs.  -     EKG 12-Lead  Chronic obstructive pulmonary disease, unspecified COPD type (HCC) Assessment & Plan: Problem is not well-controlled but has been stable. Due to insurance coverage, he has not been able to change to a different inhaler; so he will continue albuterol  1 to 2 puff every 6 hours as needed.  Orders: -     Albuterol  Sulfate HFA; Inhale 2 puffs into the lungs every 6 (six) hours as needed for wheezing or shortness of breath.  Dispense: 18 g; Refill: 3  Hypercalcemia Assessment & Plan: Chronic, for at least 9 years. Problem has been stable. Urine protein electrophoresis ordered last visit was canceled, he did not call the urine, reordered today.  Orders: -     Basic metabolic panel with GFR; Future -     Protein Electrophoresis, Urine Rflx.; Future  Vitamin D  deficiency, unspecified Assessment & Plan: Continue current dose of vitamin D  supplementation. Further recommendation will be given according to 25 OH vitamin D  result.  Orders: -     VITAMIN D  25 Hydroxy (Vit-D Deficiency, Fractures); Future  Essential hypertension Assessment & Plan: BP is not well controlled. Possible complications of elevated BP discussed. Changes today: Valsartan  dose increased from 160 mg to 320 mg daily. Continue hydrochlorothiazide  25 mg daily and Metoprolol   Low salt diet. Monitor BP at home, BP readings in 2 weeks. Instructed about warning signs. Follow-up in 4 months.  Orders: -     Basic metabolic panel with GFR; Future -      Valsartan -hydroCHLOROthiazide ; Take 1 tablet by mouth daily.  Dispense: 90 tablet; Refill: 1  Need for influenza vaccination -     Flu vaccine HIGH DOSE PF(Fluzone Trivalent)  Return in about 3 months (around 12/24/2023) for chronic problems.   Kobe Jansma, MD [facility]. [place] office.   "

## 2023-09-14 NOTE — Assessment & Plan Note (Signed)
 Chronic, for at least 9 years. Problem has been stable. Urine protein electrophoresis ordered last visit was canceled, he did not call the urine, reordered today.

## 2023-09-14 NOTE — Assessment & Plan Note (Signed)
 Hemoglobin A1c went up from 6.2 to 7.1. Continue metformin  500 mg twice daily with meals. Jardiance  10 mg added today. Continue periodic eye exam, dental care, and appropriate footcare. Follow-up in 3 to 4 months.

## 2023-09-14 NOTE — Assessment & Plan Note (Signed)
 Problem is not well-controlled, due to insurance coverage, he has not been able to change to a different inhaler; so he will continue albuterol  1 to 2 puff every 6 hours as needed.

## 2023-09-15 ENCOUNTER — Encounter: Payer: Self-pay | Admitting: Family Medicine

## 2023-09-15 LAB — BASIC METABOLIC PANEL WITH GFR
BUN: 17 mg/dL (ref 6–23)
CO2: 28 meq/L (ref 19–32)
Calcium: 10.6 mg/dL — ABNORMAL HIGH (ref 8.4–10.5)
Chloride: 103 meq/L (ref 96–112)
Creatinine, Ser: 0.83 mg/dL (ref 0.40–1.50)
GFR: 90.5 mL/min (ref >60.00)
Glucose, Bld: 140 mg/dL — ABNORMAL HIGH (ref 70–99)
Potassium: 4.4 meq/L (ref 3.5–5.1)
Sodium: 138 meq/L (ref 135–145)

## 2023-09-15 LAB — MICROALBUMIN / CREATININE URINE RATIO
Creatinine,U: 71.6 mg/dL
Microalb Creat Ratio: 16.9 mg/g (ref 0.0–30.0)
Microalb, Ur: 1.2 mg/dL (ref 0.0–1.9)

## 2023-09-15 LAB — VITAMIN D 25 HYDROXY (VIT D DEFICIENCY, FRACTURES): VITD: 39.18 ng/mL (ref 30.00–100.00)

## 2023-09-16 ENCOUNTER — Ambulatory Visit: Payer: Self-pay | Admitting: Family Medicine

## 2023-09-16 NOTE — Assessment & Plan Note (Signed)
 BP is not well controlled. Possible complications of elevated BP discussed. Changes today: Valsartan  dose increased from 160 mg to 320 mg daily. Continue hydrochlorothiazide  25 mg daily and Metoprolol   Low salt diet. Monitor BP at home, BP readings in 2 weeks. Instructed about warning signs. Follow-up in 4 months.

## 2023-09-16 NOTE — Assessment & Plan Note (Signed)
 Continue current dose of vitamin D supplementation. Further recommendation will be given according to 25 OH vitamin D result.

## 2023-09-17 LAB — PROTEIN ELECTROPHORESIS, URINE REFLEX
Albumin ELP, Urine: 32.7 %
Alpha-1-Globulin, U: 5.1 %
Alpha-2-Globulin, U: 21.1 %
Beta Globulin, U: 21.4 %
Gamma Globulin, U: 19.6 %
Protein, Ur: 8 mg/dL

## 2023-09-23 ENCOUNTER — Encounter: Payer: Self-pay | Admitting: Family Medicine

## 2023-09-24 ENCOUNTER — Other Ambulatory Visit: Payer: Self-pay | Admitting: Family Medicine

## 2023-09-24 DIAGNOSIS — E1169 Type 2 diabetes mellitus with other specified complication: Secondary | ICD-10-CM

## 2023-09-24 MED ORDER — DAPAGLIFLOZIN PROPANEDIOL 10 MG PO TABS: 10.0000 mg | ORAL_TABLET | Freq: Every day | ORAL | 1 refills | Status: AC

## 2023-09-27 NOTE — Progress Notes (Signed)
 Cardiology Office Note:   Date:  09/27/2023  ID:  Benjamin, Conner [birth year], MRN [id number] PCP: Conner, Benjamin G, MD  [facility] Providers Cardiologist:  Benjamin Archer, MD { Chief Complaint:  Chief Complaint  Patient presents with   Shortness of Breath      History of Present Illness:   Benjamin Conner is a 68 y.o. male with a PMH of HTN, HLD, DM2, nonobstructive CAD, prior tobacco use and COPD who presents for follow up.  Patient previously was seen by Dr. Dann in July 2024 for evaluation of chest pain.  He underwent a coronary CTA which revealed nonobstructive CAD.  Since his visit he has had no ED visits or hospitalizations.  Today the patient reports that he has ongoing chest discomfort that is different than his original chest pain from 1 year ago.  For the past 3 to 4 months he has had a sharp pain in the center of his chest that is not radiating.  It is mild to moderate intensity and occurs 4-5 times daily lasting up to 3 minutes at a time.  The chest pain is not exertional and occurs at random.  He further reports having shortness of breath and DOE for the past several years.  He has COPD and attributes his symptoms to this, but is unsure.  Lastly he reports having some lightheadedness upon standing.  This has been going on for several years and is not worse.  He is a prior smoker but quit 10 years ago.  He is taking all of his medications as prescribed except for Farxiga  which he had to stop due to it being too expensive.  He denies syncope, swelling, orthopnea, and palpitations.  No further concerns.  Past Medical History:  Diagnosis Date   BACK PAIN    chronic   COPD (chronic obstructive pulmonary disease) (HCC)    Diabetes mellitus without complication (HCC)    GERD    Heart murmur    Hyperlipidemia    HYPERTENSION    HYPERTROPHY PROSTATE W/UR OBST & OTH LUTS    Nocturia    Numbness of hand    since 2005   OSTEOARTHRITIS    PEPTIC ULCER  DISEASE    Personal history of colonic adenomas 09/30/2012   Pneumonia    Sleep apnea    Snoring disorder      Studies Reviewed:    EKG: I independently reviewed his EKG from 9/10 and agree that it shows sinus bradycardia.       Cardiac Studies & Procedures   ______________________________________________________________________________________________          CT SCANS  CT CORONARY MORPH W/CTA COR W/SCORE 07/16/2022  Addendum 07/16/2022 11:26 PM ADDENDUM REPORT: 07/16/2022 23:24  EXAM: OVER-READ INTERPRETATION  CT CHEST  The following report is an over-read performed by radiologist Dr. [name] [facility] on 07/16/2022. This over-read does not include interpretation of cardiac or coronary anatomy or pathology. The coronary CTA interpretation by the cardiologist is attached.  COMPARISON:  Chest radiograph dated 02/08/2012.  FINDINGS: No adenopathy noted.  Bibasilar subpleural atelectasis.  Degenerative changes of the spine.  No acute osseous pathology.  IMPRESSION: No acute or unexpected extracardiac findings.   Electronically Signed By: Vanetta Chou M.D. On: 07/16/2022 23:24  Narrative CLINICAL DATA:  This is a 68 year old male with anginal symptoms.  EXAM: Cardiac/Coronary CTA  TECHNIQUE: The patient was scanned on a [employer].  FINDINGS: A 100 kV prospective  scan was triggered in the descending thoracic aorta at 111 HU's. Axial non-contrast 3 mm slices were carried out through the heart. The data set was analyzed on a dedicated work station and scored using the Agatson method. Gantry rotation speed was 250 msecs and collimation was .6 mm. No beta blockade and 0.8 mg of sl NTG was given. The 3D data set was reconstructed in 5% intervals of the 67-82 % of the R-R cycle. Diastolic phases were analyzed on a dedicated work station using MPR, MIP and VRT modes. The patient received 80 cc of contrast.  Image  Quality: Fair with registration artifact.  Aorta: Normal size. Mild aortic root calcifications. No dissection.  Aortic Valve:  Trileaflet.  No calcifications.  Coronary Arteries:  Normal coronary origin.  Right dominance.  RCA is a large dominant artery that gives rise to PDA and PLA. There are minimal (<24%) mixed plaques in the proximal RCA. The mid RCA with no plaques. The distal RCA with minimal plaques.  Left main is a large artery that gives rise to LAD and LCX arteries. The is a focal mild (25-49%) calcified plaque in the mid portion of the left main.  LAD is a large vessel that has no plaque. D1 and D2 vessels with no plaques.  LCX is a non-dominant artery that gives rise to one large OM1 branch. There is no plaque.  Coronary Calcium  Score:  Left main: 28  Left anterior descending artery: 0  Left circumflex artery: 0  Right coronary artery: 11.6  Total: 39.6  Percentile: 26  Other findings:  Normal pulmonary vein drainage into the left atrium.  Normal left atrial appendage without a thrombus.  Normal size of the pulmonary artery.  Moderate mitral annular calcifications.  IMPRESSION: 1. Coronary calcium  score of 39.6. This was 26 percentile for age and sex matched control.  2. Normal coronary origin with right dominance.  3. CAD-RADS 2. Mild non-obstructive CAD (25-49%). Consider non-atherosclerotic causes of chest pain. Consider preventive therapy and risk factor modification.  The noncardiac portion of this study will be interpreted in separate report by the radiologist.  Electronically Signed: By: Kardie  Tobb D.O. On: 07/16/2022 21:41     ______________________________________________________________________________________________      Risk Assessment/Calculations:              Physical Exam:     VS:  BP 130/74 (BP Location: Left Arm, Patient Position: Sitting, Cuff Size: Normal)   Pulse 70   Ht 6' 3 (1.905 m)   Wt 229 lb 3.2  oz (104 kg)   SpO2 95%   BMI 28.65 kg/m      Wt Readings from Last 3 Encounters:  09/14/23 231 lb 6.4 oz (105 kg)  03/16/23 217 lb 8 oz (98.7 kg)  09/15/22 222 lb (100.7 kg)     GEN: Well nourished, well developed, in no acute distress NECK: No JVD; No carotid bruits CARDIAC: RRR, no murmurs, rubs, gallops, no tenderness to palpation along costo phrenic angles RESPIRATORY:  Clear to auscultation without rales, wheezing or rhonchi  ABDOMEN: Soft, non-tender, non-distended, normal bowel sounds EXTREMITIES:  Warm and well perfused, no edema; No deformity, 2+ radial pulses PSYCH: Normal mood and affect   Assessment & Plan DOE (dyspnea on exertion) Patient has ongoing SOB and DOE.  No stigmata of heart failure by physical exam; however, he has several risk factors for cardiac disease.  I will order complete echo to rule out structural heart abnormalities as a cause for his  SOB.  If negative then it is likely related to his underlying lung disease. -Complete echo  Non-cardiac chest pain The sharp chest pain that he is reporting sounds to be noncardiac to me.  He had a negative CCTA last year.  No ischemic evaluation is indicated at this time.  Coronary artery disease involving native coronary artery of native heart without angina pectoris Has no nonobstructive CAD.  Continue baby aspirin daily. - Follow-up in 12 months  Mixed hyperlipidemia Last lipid panel in January showed a well-controlled LDL of 66; however, his triglycerides were markedly elevated.  He is fasting today so I will repeat a lipid panel to ensure that his triglycerides have normalized.  If not, we will need to consider additional therapy for management. -Lipid panel Primary hypertension His blood pressure is at goal today, but he states that the last time he saw his PCP his blood pressure was markedly elevated.  His PCP appropriately increased his antihypertensive regiment, but he remains concerned.  We will prescribe  him a blood pressure cuff to check his blood pressures at home and to document them for the next 2 weeks. -Continue current hypertension regimen - Blood pressure cuff  Orthostatic hypotension His dizziness has been going on for several years sounds a orthostasis.  I provided the patient with counterpressure maneuvers for symptomatic management.           This note was written with the assistance of a dictation microphone or AI dictation software. Please excuse any typos or grammatical errors.   Signed, Benjamin Archer, MD 09/27/2023 1:51 PM    [facility]

## 2023-09-28 ENCOUNTER — Ambulatory Visit
Attending: Student in an Organized Health Care Education/Training Program | Admitting: Student in an Organized Health Care Education/Training Program

## 2023-09-28 ENCOUNTER — Encounter: Payer: Self-pay | Admitting: Student in an Organized Health Care Education/Training Program

## 2023-09-28 ENCOUNTER — Other Ambulatory Visit (HOSPITAL_COMMUNITY): Payer: Self-pay

## 2023-09-28 VITALS — BP 130/74 | HR 70 | Ht 75.0 in | Wt 229.2 lb

## 2023-09-28 DIAGNOSIS — R0609 Other forms of dyspnea: Secondary | ICD-10-CM

## 2023-09-28 DIAGNOSIS — I10 Essential (primary) hypertension: Secondary | ICD-10-CM

## 2023-09-28 DIAGNOSIS — I2510 Atherosclerotic heart disease of native coronary artery without angina pectoris: Secondary | ICD-10-CM | POA: Diagnosis not present

## 2023-09-28 DIAGNOSIS — R0789 Other chest pain: Secondary | ICD-10-CM | POA: Diagnosis not present

## 2023-09-28 DIAGNOSIS — I951 Orthostatic hypotension: Secondary | ICD-10-CM

## 2023-09-28 DIAGNOSIS — E782 Mixed hyperlipidemia: Secondary | ICD-10-CM | POA: Diagnosis not present

## 2023-09-28 MED ORDER — BLOOD PRESSURE MONITOR MISC
0 refills | Status: AC
  Filled 2023-09-28: qty 1, 30d supply, fill #0

## 2023-09-28 NOTE — Patient Instructions (Addendum)
 Medication Instructions:  Blood pressure cuff (CHECK BP TWICE DAILY FOR TWO WEEKS AND ADVISE RESULTS)  Blood Pressure Record Sheet To take your blood pressure, you will need a blood pressure machine. You can buy a blood pressure machine (blood pressure monitor) at your clinic, drug store, or online. When choosing one, consider: An automatic monitor that has an arm cuff. A cuff that wraps snugly around your upper arm. You should be able to fit only one finger between your arm and the cuff. A device that stores blood pressure reading results. Do not choose a monitor that measures your blood pressure from your wrist or finger. Follow your health care provider's instructions for how to take your blood pressure. To use this form: Take your blood pressure medications every day These measurements should be taken when you have been at rest for at least 10-15 min Take at least 2 readings with each blood pressure check. This makes sure the results are correct. Wait 1-2 minutes between measurements. Write down the results in the spaces on this form. Keep in mind it should always be recorded systolic over diastolic. Both numbers are important.  Repeat this every day for 2-3 weeks, or as told by your health care provider.  Make a follow-up appointment with your health care provider to discuss the results.  Blood Pressure Log Date Medications taken? (Y/N) Blood Pressure Time of Day                                                                                                         *If you need a refill on your cardiac medications before your next appointment, please call your pharmacy*  Lab Work: Lipid panel   If you have labs (blood work) drawn today and your tests are completely normal, you will receive your results only by: MyChart Message (if you have MyChart) OR A paper copy in the mail If you have any lab test that is abnormal or we need to change your treatment, we will  call you to review the results.  Testing/Procedures: Echocardiogram  Your physician has requested that you have an echocardiogram. Echocardiography is a painless test that uses sound waves to create images of your heart. It provides your doctor with information about the size and shape of your heart and how well your heart's chambers and valves are working. This procedure takes approximately one hour. There are no restrictions for this procedure. Please do NOT wear cologne, perfume, aftershave, or lotions (deodorant is allowed). Please arrive 15 minutes prior to your appointment time.  Please note: We ask at that you not bring children with you during ultrasound (echo/ vascular) testing. Due to room size and safety concerns, children are not allowed in the ultrasound rooms during exams. Our front office staff cannot provide observation of children in our lobby area while testing is being conducted. An adult accompanying a patient to their appointment will only be allowed in the ultrasound room at the discretion of the ultrasound technician under special circumstances. We apologize for any inconvenience.   Follow-Up: At  [facility], you and your health needs are our priority.  As part of our continuing mission to provide you with exceptional heart care, our providers are all part of one team.  This team includes your primary Cardiologist (physician) and Advanced Practice Providers or APPs (Physician Assistants and Nurse Practitioners) who all work together to provide you with the care you need, when you need it.  Your next appointment:   1 year(s)  Provider:   [name], MD       COUNTER PRESSURE:  [email or web address]

## 2023-09-28 NOTE — Assessment & Plan Note (Signed)
 Has no nonobstructive CAD.  Continue baby aspirin daily. - Follow-up in 12 months

## 2023-09-28 NOTE — Assessment & Plan Note (Signed)
 His blood pressure is at goal today, but he states that the last time he saw his PCP his blood pressure was markedly elevated.  His PCP appropriately increased his antihypertensive regiment, but he remains concerned.  We will prescribe him a blood pressure cuff to check his blood pressures at home and to document them for the next 2 weeks. -Continue current hypertension regimen - Blood pressure cuff

## 2023-09-29 ENCOUNTER — Ambulatory Visit: Payer: Self-pay | Admitting: Student in an Organized Health Care Education/Training Program

## 2023-09-29 LAB — LIPID PANEL
Chol/HDL Ratio: 3.3 ratio (ref 0.0–5.0)
Cholesterol, Total: 158 mg/dL (ref 100–199)
HDL: 48 mg/dL (ref >39)
LDL Chol Calc (NIH): 71 mg/dL (ref 0–99)
Triglycerides: 239 mg/dL — ABNORMAL HIGH (ref 0–149)
VLDL Cholesterol Cal: 39 mg/dL (ref 5–40)

## 2023-09-29 MED ORDER — ICOSAPENT ETHYL 1 G PO CAPS: 2.0000 g | ORAL_CAPSULE | Freq: Two times a day (BID) | ORAL | 3 refills | Status: AC

## 2023-09-29 MED ORDER — EZETIMIBE 10 MG PO TABS: 10.0000 mg | ORAL_TABLET | Freq: Every day | ORAL | 3 refills | Status: AC

## 2023-11-02 ENCOUNTER — Ambulatory Visit (HOSPITAL_COMMUNITY): Attending: Cardiology

## 2023-11-03 ENCOUNTER — Encounter (HOSPITAL_COMMUNITY): Payer: Self-pay | Admitting: Student in an Organized Health Care Education/Training Program

## 2023-12-01 ENCOUNTER — Telehealth: Payer: Self-pay

## 2023-12-01 ENCOUNTER — Telehealth: Payer: Self-pay | Admitting: *Deleted

## 2023-12-01 DIAGNOSIS — E1169 Type 2 diabetes mellitus with other specified complication: Secondary | ICD-10-CM

## 2023-12-01 MED ORDER — METFORMIN HCL 500 MG PO TABS: 500.0000 mg | ORAL_TABLET | Freq: Two times a day (BID) | ORAL | 0 refills | Status: DC

## 2023-12-01 NOTE — Telephone Encounter (Signed)
-----   Message from Betty Jordan sent at 12/01/2023  4:10 PM EST ----- Yes, please. He is supposed to follow in 12/2023 or 01/2024. Thanks, BJ ----- Message ----- From: Kathryne Vernell NOVAK, CMA Sent: 12/01/2023   1:59 PM EST To: [name] Jordan, MD  Okay to fill? ----- Message ----- From: Lionell Jon DEL, [facility] Sent: 12/01/2023   1:47 PM EST To: [facility]  Hello,  Pharmacy requesting refill for the following prescription:  Metformin  500mg  1 tab by mouth twice daily  Pharmacy Info: [facility] [address] [place], [place] - [address] P: [phone] F: [phone]  Thank you! Jon DEL Lionell, PharmD Clinical Pharmacist [phone]

## 2023-12-01 NOTE — Progress Notes (Signed)
   12/01/2023  Patient ID: Benjamin Conner, male   DOB: September 07, 1955, 68 y.o.   MRN: [id number]  Pharmacy Quality Measure Review  This patient is appearing on a report for being at risk of failing the adherence measure for diabetes medications this calendar year.   Medication: Metformin  Last fill date: 07/19/23 for 90 day supply  Will collaborate with provider to facilitate refill needs.  Jon VEAR [place]au, PharmD Clinical Pharmacist [phone]

## 2023-12-28 ENCOUNTER — Ambulatory Visit (HOSPITAL_COMMUNITY)
Admission: RE | Admit: 2023-12-28 | Discharge: 2023-12-28 | Disposition: A | Discharge status: Home / Self Care | Source: Ambulatory Visit

## 2023-12-28 DIAGNOSIS — R0609 Other forms of dyspnea: Secondary | ICD-10-CM | POA: Diagnosis present

## 2023-12-28 DIAGNOSIS — R0600 Dyspnea, unspecified: Secondary | ICD-10-CM

## 2023-12-29 LAB — ECHOCARDIOGRAM COMPLETE
Area-P 1/2: 3.02 cm2
S' Lateral: 2.3 cm

## 2024-01-18 ENCOUNTER — Ambulatory Visit

## 2024-01-18 ENCOUNTER — Encounter: Payer: Self-pay | Admitting: Family Medicine

## 2024-01-18 ENCOUNTER — Ambulatory Visit (INDEPENDENT_AMBULATORY_CARE_PROVIDER_SITE_OTHER): Admitting: Family Medicine

## 2024-01-18 VITALS — BP 130/74 | HR 77 | Temp 98.2°F | Resp 16 | Ht 75.0 in | Wt 225.8 lb

## 2024-01-18 DIAGNOSIS — I10 Essential (primary) hypertension: Secondary | ICD-10-CM | POA: Diagnosis not present

## 2024-01-18 DIAGNOSIS — E1169 Type 2 diabetes mellitus with other specified complication: Secondary | ICD-10-CM

## 2024-01-18 DIAGNOSIS — R059 Cough, unspecified: Secondary | ICD-10-CM | POA: Diagnosis not present

## 2024-01-18 DIAGNOSIS — Z7984 Long term (current) use of oral hypoglycemic drugs: Secondary | ICD-10-CM | POA: Diagnosis not present

## 2024-01-18 DIAGNOSIS — J449 Chronic obstructive pulmonary disease, unspecified: Secondary | ICD-10-CM | POA: Diagnosis not present

## 2024-01-18 DIAGNOSIS — J988 Other specified respiratory disorders: Secondary | ICD-10-CM | POA: Diagnosis not present

## 2024-01-18 DIAGNOSIS — E785 Hyperlipidemia, unspecified: Secondary | ICD-10-CM

## 2024-01-18 LAB — POCT GLYCOSYLATED HEMOGLOBIN (HGB A1C): Hemoglobin A1C: 7.4 % — AB (ref 4.0–5.6)

## 2024-01-18 MED ORDER — BUDESONIDE-FORMOTEROL FUMARATE 160-4.5 MCG/ACT IN AERO: 2.0000 | INHALATION_SPRAY | Freq: Two times a day (BID) | RESPIRATORY_TRACT | 3 refills | Status: AC

## 2024-01-18 MED ORDER — DOXYCYCLINE HYCLATE 100 MG PO TABS: 100.0000 mg | ORAL_TABLET | Freq: Two times a day (BID) | ORAL | 0 refills | Status: AC

## 2024-01-18 MED ORDER — METFORMIN HCL 500 MG PO TABS: 1000.0000 mg | ORAL_TABLET | Freq: Two times a day (BID) | ORAL | 2 refills | Status: AC

## 2024-01-18 NOTE — Assessment & Plan Note (Signed)
 Last LDL 71 in 09/2023. Currently on atorvastatin  80 mg daily, Zetia  10 mg daily, and Vascepa  1 g 2 capsules twice daily. Continue low-fat diet. We can plan on fasting lipid panel next visit. Follows with cardiologist annually.

## 2024-01-18 NOTE — Assessment & Plan Note (Signed)
 Reporting worsening cough, dyspnea, and wheezing since having acute URI symptoms. Currently he is on albuterol  inh prn. He recently completed prednisone  treatment for right hip bursitis, so I prefer to hold on systemic steroids for now. Recommend Symbicort  160-4.5 mcg 2 puff twice daily for 2 to 3 weeks.

## 2024-01-18 NOTE — Assessment & Plan Note (Signed)
 With associated hypertension and hyperlipidemia. Problem is not well-controlled, A1c today 7.4, went up from 7.1 in 09/2023. Currently he is on metformin  500 mg twice daily, recommend increasing dose to 1000 mg twice daily with food. Continue Farxiga  10 mg daily. Continue periodic eye exam, dental care, and appropriate footcare. Follow-up in 3 to 4 months.

## 2024-01-18 NOTE — Progress Notes (Signed)
 "  Chief Complaint  Patient presents with   Follow-up    4 month F/U   Discussed the use of AI scribe software for clinical note transcription with the patient, who gave verbal consent to proceed.  History of Present Illness Benjamin Conner is a 69 year old male with with a PMHx significant for  hypertension, DM II, and coronary artery disease, COPD, HLD,and chronic back pain who is here today for chronic disease management. Last seen on 09/14/2023. Since his last visit he has seen cardiologist, Dr. Floretta, 09/28/2023 was the last visit.  During this visit her hyperlipidemia treatment was adjusted, started on Zetia  10 mg daily and Vascepa  1 g 2 capsules twice daily.  She is still taking atorvastatin  80 mg daily.  Lab Results  Component Value Date   CHOL 158 09/28/2023   HDL 48 09/28/2023   LDLCALC 71 09/28/2023   LDLDIRECT 82.0 10/27/2021   TRIG 239 (H) 09/28/2023   CHOLHDL 3.3 09/28/2023   He has been experiencing a worsening cough, described as 'horrendous', shortness of breath, and fever for the past nine days. The cough became significantly worse two days ago. He has had episodes of fever, the last occurring last night, although he has not measured his temperature at home. Increased difficulty breathing and wheezing were noted this morning with exertion, and he has been using his albuterol  inhaler for relief.  He last took prednisone  about three weeks ago for left hip bursitis.  He notes that he was the first in his household to become ill, subsequently passing it to his wife.  Hypertension: Currently he is on metoprolol  succinate 25 mg daily and valsartan -HCTZ 320-25 mg daily. SBP 170's but was adequate during his recent cardiology appt. Negative for unusual or severe headache, visual changes, exertional chest pain, focal weakness, or edema. Following annually with cardiologist.  Lab Results  Component Value Date   CREATININE 0.83 09/14/2023   BUN 17 09/14/2023   NA 138  09/14/2023   K 4.4 09/14/2023   CL 103 09/14/2023   CO2 28 09/14/2023   Diabetes Mellitus II: Dx'ed 2017   he is taking metformin  500 mg twice daily and dapagliflozin  10 mg daily. He has not been checking his blood sugars at home Occasional tingling and burning sensation in feet, is stable. Negative for symptoms of hypoglycemia, polyuria, polydipsia, foot ulcers.  Lab Results  Component Value Date   HGBA1C 7.1 (A) 09/14/2023   Lab Results  Component Value Date   MICROALBUR 1.2 09/14/2023   Review of Systems  Constitutional:  Positive for activity change and appetite change.  HENT:  Positive for postnasal drip. Negative for ear pain and sore throat.   Gastrointestinal:  Negative for abdominal pain, nausea and vomiting.  Genitourinary:  Negative for decreased urine volume, dysuria and hematuria.  Musculoskeletal:  Positive for arthralgias.  Skin:  Negative for rash.  Neurological:  Negative for syncope and facial asymmetry.  Psychiatric/Behavioral:  Negative for confusion and hallucinations.   See other pertinent positives and negatives in HPI.  Medications Ordered Prior to Encounter[1]  Past Medical History:  Diagnosis Date   BACK PAIN    chronic   COPD (chronic obstructive pulmonary disease) (HCC)    Diabetes mellitus without complication (HCC)    GERD    Heart murmur    Hyperlipidemia    HYPERTENSION    HYPERTROPHY PROSTATE W/UR OBST & OTH LUTS    Nocturia    Numbness of hand    since  2005   OSTEOARTHRITIS    PEPTIC ULCER DISEASE    Personal history of colonic adenomas 09/30/2012   Pneumonia    Sleep apnea    Snoring disorder     Allergies[2]  Social History   Socioeconomic History   Marital status: Married    Spouse name: Almarie   Number of children: 2   Years of education: 12   Highest education level: 12th grade  Occupational History   Occupation: Conservation Officer, Nature    Comment: [employer]  Tobacco Use   Smoking status: Former    Current packs/day:  0.00    Average packs/day: 1 pack/day for 36.0 years (36.0 ttl pk-yrs)    Types: Cigarettes    Start date: 01/06/1971    Quit date: 01/06/2007    Years since quitting: 17.0   Smokeless tobacco: Never   Tobacco comments:    quit 8\2009  Vaping Use   Vaping status: Never Used  Substance and Sexual Activity   Alcohol use: Yes    Alcohol/week: 3.0 standard drinks of alcohol    Types: 3 Shots of liquor per week    Comment: 2-3 daily   Drug use: Never   Sexual activity: Yes  Other Topics Concern   Not on file  Social History Narrative   Patient is married to [place]. Patient has a high school education. Patient is a conservation officer, nature for [employer].   Caffeine- four daily.   Left handed.   Social Drivers of Health   Tobacco Use: Medium Risk (01/18/2024)   Patient History    Smoking Tobacco Use: Former    Smokeless Tobacco Use: Never    Passive Exposure: Not on file  Financial Resource Strain: Medium Risk (03/15/2023)   Overall Financial Resource Strain (CARDIA)    Difficulty of Paying Living Expenses: Somewhat hard  Food Insecurity: No Food Insecurity (03/15/2023)   Hunger Vital Sign    Worried About Running Out of Food in the Last Year: Never true    Ran Out of Food in the Last Year: Never true  Transportation Needs: No Transportation Needs (03/15/2023)   PRAPARE - Administrator, Civil Service (Medical): No    Lack of Transportation (Non-Medical): No  Physical Activity: Sufficiently Active (03/15/2023)   Exercise Vital Sign    Days of Exercise per Week: 5 days    Minutes of Exercise per Session: 150+ min  Stress: No Stress Concern Present (03/15/2023)   [employer] of Occupational Health - Occupational Stress Questionnaire    Feeling of Stress : Not at all  Social Connections: Moderately Isolated (03/15/2023)   Social Connection and Isolation Panel    Frequency of Communication with Friends and Family: Never    Frequency of Social Gatherings with Friends and Family:  Once a week    Attends Religious Services: 1 to 4 times per year    Active Member of [employer] or Organizations: No    Attends Banker Meetings: Not on file    Marital Status: Married  Depression (PHQ2-9): Low Risk (03/16/2023)   Depression (PHQ2-9)    PHQ-2 Score: 0  Alcohol Screen: Medium Risk (03/15/2023)   Alcohol Screen    Last Alcohol Screening Score (AUDIT): 9  Housing: Low Risk (03/15/2023)   Housing Stability Vital Sign    Unable to Pay for Housing in the Last Year: No    Number of Times Moved in the Last Year: 0    Homeless in the Last Year: No  Utilities: Not  on file  Health Literacy: Not on file    Today's Vitals   01/18/24 1412  BP: 130/74  Pulse: 77  Resp: 16  Temp: 98.2 F (36.8 C)  SpO2: 95%  Weight: 225 lb 12.8 oz (102.4 kg)  Height: 6' 3 (1.905 m)   Body mass index is 28.22 kg/m.  Physical Exam Vitals and nursing note reviewed.  Constitutional:      General: He is not in acute distress.    Appearance: He is well-developed.  HENT:     Head: Normocephalic and atraumatic.     Mouth/Throat:     Mouth: Mucous membranes are moist.     Pharynx: Oropharynx is clear.  Eyes:     Conjunctiva/sclera: Conjunctivae normal.  Cardiovascular:     Rate and Rhythm: Normal rate and regular rhythm.     Pulses:          Dorsalis pedis pulses are 2+ on the right side and 2+ on the left side.     Heart sounds: No murmur heard. Pulmonary:     Effort: Pulmonary effort is normal. Prolonged expiration present. No respiratory distress.     Breath sounds: Normal breath sounds. No wheezing or rales.     Comments: Course noises right base, cleared after cough. Abdominal:     Palpations: Abdomen is soft. There is no mass.     Tenderness: There is no abdominal tenderness.  Lymphadenopathy:     Cervical: No cervical adenopathy.  Skin:    General: Skin is warm.     Findings: No erythema or rash.  Neurological:     Mental Status: He is alert and oriented to person,  place, and time.     Cranial Nerves: No cranial nerve deficit.     Gait: Gait normal.  Psychiatric:        Mood and Affect: Mood and affect normal.   ASSESSMENT AND PLAN:  Benjamin Conner was seen today for follow-up.  Diagnoses and all orders for this visit:  Orders Placed This Encounter  Procedures   DG Chest 2 View   POC HgB A1c   Lab Results  Component Value Date   HGBA1C 7.4 (A) 01/18/2024   Type 2 diabetes mellitus with other specified complication, without long-term current use of insulin (HCC) Assessment & Plan: With associated hypertension and hyperlipidemia. Problem is not well-controlled, A1c today 7.4, went up from 7.1 in 09/2023. Currently he is on metformin  500 mg twice daily, recommend increasing dose to 1000 mg twice daily with food. Continue Farxiga  10 mg daily. Continue periodic eye exam, dental care, and appropriate footcare. Follow-up in 3 to 4 months.  Orders: -     POCT glycosylated hemoglobin (Hb A1C) -     metFORMIN  HCl; Take 2 tablets (1,000 mg total) by mouth 2 (two) times daily with a meal.  Dispense: 360 tablet; Refill: 2  Chronic obstructive pulmonary disease, unspecified COPD type (HCC) Assessment & Plan: Reporting worsening cough, dyspnea, and wheezing since having acute URI symptoms. Currently he is on albuterol  inh prn. He recently completed prednisone  treatment for right hip bursitis, so I prefer to hold on systemic steroids for now. Recommend Symbicort  160-4.5 mcg 2 puff twice daily for 2 to 3 weeks.  Hyperlipidemia associated with type 2 diabetes mellitus (HCC) Assessment & Plan: Last LDL 71 in 09/2023. Currently on atorvastatin  80 mg daily, Zetia  10 mg daily, and Vascepa  1 g 2 capsules twice daily. Continue low-fat diet. We can plan on fasting lipid  panel next visit. Follows with cardiologist annually.  Essential hypertension Assessment & Plan: He reports some elevated his BPs at home, today BP adequately controlled and it was  similar at his cardiologist's office during follow-up. For now continue valsartan -HCTZ 320-25 mg daily and metoprolol  succinate 25 mg daily. Continue low salt diet. Continue monitoring BP regularly. Follow-up in 4 months.  Cough, unspecified type -     DG Chest 2 View; Future  Respiratory tract infection Symptoms have been going on for about 9 days, reported subjective fever last night.  On auscultation I did not appreciate rales or wheezing, mild coarse noises in the right base, cleared after coughing. Hemodynamically stable, not in respiratory distress. Chest x-ray ordered today,?  Of infiltrates in right base. Recommend doxycycline  100 mg twice daily x 5 days. Symbicort  160-4.5 mcg 2 puff twice daily for 2 to 3 weeks.  Rinse after use. Clearly instructed about warning signs.  -     Doxycycline  Hyclate; Take 1 tablet (100 mg total) by mouth 2 (two) times daily for 7 days.  Dispense: 14 tablet; Refill: 0 -     Budesonide -Formoterol  Fumarate; Inhale 2 puffs into the lungs 2 (two) times daily.  Dispense: 1 each; Refill: 3  Return in about 4 months (around 05/17/2024) for chronic problems.   Orra Nolde, MD [facility]. [place] office.      [1]  Current Outpatient Medications on File Prior to Visit  Medication Sig Dispense Refill   Accu-Chek Softclix Lancets lancets Use as instructed 100 each 12   albuterol  (VENTOLIN  HFA) 108 (90 Base) MCG/ACT inhaler Inhale 2 puffs into the lungs every 6 (six) hours as needed for wheezing or shortness of breath. 18 g 3   aspirin 81 MG tablet Take 81 mg by mouth daily.     atorvastatin  (LIPITOR) 80 MG tablet Take 1 tablet (80 mg total) by mouth daily. 90 tablet 3   Blood Glucose Monitoring Suppl (ACCU-CHEK GUIDE) w/Device KIT Use to check blood sugars 1-2 times daily; dx:e11.9 1 kit 0   Blood Pressure Monitor MISC Use as directed to monitor blood pressure 1 each 0   Cholecalciferol (VITAMIN D  PO) Take by mouth.     dapagliflozin   propanediol (FARXIGA ) 10 MG TABS tablet Take 1 tablet (10 mg total) by mouth daily. In the morning 90 tablet 1   esomeprazole  (NEXIUM ) 20 MG packet Take 20 mg by mouth daily as needed.     ezetimibe  (ZETIA ) 10 MG tablet Take 1 tablet (10 mg total) by mouth daily. 90 tablet 3   fluticasone  (FLONASE ) 50 MCG/ACT nasal spray Use 2 spray(s) in each nostril once daily 48 g 1   glucose blood test strip Use to test blood sugars 1-2 times daily. 100 each 12   icosapent  Ethyl (VASCEPA ) 1 g capsule Take 2 capsules (2 g total) by mouth 2 (two) times daily. 360 capsule 3   metoprolol  succinate (TOPROL -XL) 25 MG 24 hr tablet Take 1 tablet (25 mg total) by mouth daily. 90 tablet 2   Misc Natural Products (MENS PROSTATE HEALTH FORMULA PO) Take by mouth.     valsartan -hydrochlorothiazide  (DIOVAN -HCT) 320-25 MG tablet Take 1 tablet by mouth daily. 90 tablet 1   No current facility-administered medications on file prior to visit.  [2]  Allergies Allergen Reactions   Asa [Aspirin]     Reacts as a sleeping pill   "

## 2024-01-18 NOTE — Patient Instructions (Addendum)
 A few things to remember from today's visit:  Type 2 diabetes mellitus with other specified complication, without long-term current use of insulin (HCC) - Plan: POC HgB A1c, metFORMIN  (GLUCOPHAGE ) 500 MG tablet  Essential hypertension  Cough, unspecified type - Plan: DG Chest 2 View  Respiratory tract infection - Plan: doxycycline  (VIBRA -TABS) 100 MG tablet Increase Metformin  from 1 tab 2 times daily to 2 tabs 2 times daily with food. Symbicort  2 puff 2 times daily for 2-3 weeks, rinse after use. If not covered by insurance continue Albuterol  inh 2 puff every 6 hours for a week then as needed for wheezing or shortness of breath.  Start taking antibiotic. Plain Mucinex may help.  If you need refills for medications you take chronically, please call your pharmacy. Do not use My Chart to request refills or for acute issues that need immediate attention. If you send a my chart message, it may take a few days to be addressed, specially if I am not in the office.  Please be sure medication list is accurate. If a new problem present, please set up appointment sooner than planned today.

## 2024-01-18 NOTE — Assessment & Plan Note (Signed)
 He reports some elevated his BPs at home, today BP adequately controlled and it was similar at his cardiologist's office during follow-up. For now continue valsartan -HCTZ 320-25 mg daily and metoprolol  succinate 25 mg daily. Continue low salt diet. Continue monitoring BP regularly. Follow-up in 4 months.

## 2024-01-26 ENCOUNTER — Ambulatory Visit: Payer: Self-pay | Admitting: Family Medicine

## 2024-05-23 ENCOUNTER — Ambulatory Visit: Admitting: Family Medicine
